# Patient Record
Sex: Female | Born: 1965 | Race: White | Hispanic: No | Marital: Single | Smoking: Never smoker
Health system: Southern US, Community
[De-identification: ages and names within clinical notes are randomized; demographics above are authoritative.]

## PROBLEM LIST (undated history)

## (undated) DIAGNOSIS — T7840XA Allergy, unspecified, initial encounter: Secondary | ICD-10-CM

## (undated) DIAGNOSIS — F32A Depression, unspecified: Secondary | ICD-10-CM

## (undated) DIAGNOSIS — E079 Disorder of thyroid, unspecified: Secondary | ICD-10-CM

## (undated) DIAGNOSIS — I10 Essential (primary) hypertension: Secondary | ICD-10-CM

## (undated) DIAGNOSIS — F329 Major depressive disorder, single episode, unspecified: Secondary | ICD-10-CM

## (undated) HISTORY — DX: Depression, unspecified: F32.A

## (undated) HISTORY — DX: Essential (primary) hypertension: I10

## (undated) HISTORY — DX: Major depressive disorder, single episode, unspecified: F32.9

## (undated) HISTORY — DX: Allergy, unspecified, initial encounter: T78.40XA

## (undated) HISTORY — DX: Disorder of thyroid, unspecified: E07.9

## (undated) HISTORY — PX: CHOLECYSTECTOMY: SHX55

## (undated) HISTORY — PX: GALLBLADDER SURGERY: SHX652

---

## 2007-07-20 DIAGNOSIS — D509 Iron deficiency anemia, unspecified: Secondary | ICD-10-CM | POA: Insufficient documentation

## 2007-11-16 DIAGNOSIS — I10 Essential (primary) hypertension: Secondary | ICD-10-CM | POA: Insufficient documentation

## 2010-07-02 DIAGNOSIS — G4700 Insomnia, unspecified: Secondary | ICD-10-CM | POA: Insufficient documentation

## 2016-09-13 ENCOUNTER — Encounter (INDEPENDENT_AMBULATORY_CARE_PROVIDER_SITE_OTHER): Payer: Self-pay

## 2016-09-13 ENCOUNTER — Ambulatory Visit (INDEPENDENT_AMBULATORY_CARE_PROVIDER_SITE_OTHER): Payer: BLUE CROSS/BLUE SHIELD | Admitting: Pediatrics

## 2016-09-13 ENCOUNTER — Encounter: Payer: Self-pay | Admitting: Pediatrics

## 2016-09-13 VITALS — BP 126/86 | HR 80 | Temp 98.0°F | Ht 67.0 in | Wt 252.4 lb

## 2016-09-13 DIAGNOSIS — Z1231 Encounter for screening mammogram for malignant neoplasm of breast: Secondary | ICD-10-CM | POA: Diagnosis not present

## 2016-09-13 DIAGNOSIS — Z6839 Body mass index (BMI) 39.0-39.9, adult: Secondary | ICD-10-CM | POA: Diagnosis not present

## 2016-09-13 DIAGNOSIS — R42 Dizziness and giddiness: Secondary | ICD-10-CM

## 2016-09-13 DIAGNOSIS — H8113 Benign paroxysmal vertigo, bilateral: Secondary | ICD-10-CM

## 2016-09-13 DIAGNOSIS — I10 Essential (primary) hypertension: Secondary | ICD-10-CM

## 2016-09-13 DIAGNOSIS — Z1239 Encounter for other screening for malignant neoplasm of breast: Secondary | ICD-10-CM

## 2016-09-13 DIAGNOSIS — R350 Frequency of micturition: Secondary | ICD-10-CM

## 2016-09-13 LAB — URINALYSIS, COMPLETE
BILIRUBIN UA: NEGATIVE
GLUCOSE, UA: NEGATIVE
KETONES UA: NEGATIVE
Leukocytes, UA: NEGATIVE
NITRITE UA: NEGATIVE
Protein, UA: NEGATIVE
RBC UA: NEGATIVE
SPEC GRAV UA: 1.02 (ref 1.005–1.030)
UUROB: 0.2 mg/dL (ref 0.2–1.0)
pH, UA: 8.5 — ABNORMAL HIGH (ref 5.0–7.5)

## 2016-09-13 LAB — MICROSCOPIC EXAMINATION
RBC, UA: NONE SEEN /hpf (ref 0–?)
RENAL EPITHEL UA: NONE SEEN /HPF

## 2016-09-13 MED ORDER — LISINOPRIL 20 MG PO TABS: 20.0000 mg | ORAL_TABLET | Freq: Every day | ORAL | 1 refills | Status: DC

## 2016-09-13 MED ORDER — MECLIZINE HCL 12.5 MG PO TABS: 12.5000 mg | ORAL_TABLET | Freq: Three times a day (TID) | ORAL | 1 refills | Status: DC | PRN

## 2016-09-13 NOTE — Progress Notes (Signed)
Subjective:   Patient ID: Kathy Norman, female    DOB: [birth year], 50 y.o.   MRN: [id number] CC: New Patient (Initial Visit); Dizziness; Urinary Urgency; and Trouble sleeping  HPI: Kathy Norman is a 50 y.o. female presenting for New Patient (Initial Visit); Dizziness; Urinary Urgency; and Trouble sleeping  Recently moved to area to be with BF Now cares for 50yo Before was in [place]  Has had some urinary urgency, past few weeks Has had to get up at night mltiple times doesnt think she has been drinking any differently  When she looks to L or R for the past few days, she will get a few seconds of room spinning Goes away after that Has not had problems with dizziness in the past Still able to do all of ADLs  No chest pain, no SOB No fevers Appetite normal  Had colonoscopy in 2008 for rectal bleeding, found to have internal hemorrhoids Recent pap smear prior   Depression screen PHQ 2/9 09/13/2016  Decreased Interest 0  Down, Depressed, Hopeless 0  PHQ - 2 Score 0     Past Medical History:  Diagnosis Date  . Depression   . Hypertension    Family History  Problem Relation Age of Onset  . Heart disease Mother   . Cancer Brother   . Cancer Maternal Aunt   . Cancer Maternal Uncle   . Cancer Maternal Grandmother   Breast cancer in two aunts and GM on mom's side Brother died at 41 with MI  Social History   Social History  . Marital status: Single    Spouse name: N/A  . Number of children: N/A  . Years of education: N/A   Social History Main Topics  . Smoking status: Not on file  . Smokeless tobacco: Not on file  . Alcohol use Not on file  . Drug use: Unknown  . Sexual activity: Not on file   Other Topics Concern  . Not on file   Social History Narrative  . No narrative on file   ROS: All systems negative other than what is in HPI  Objective:    BP 126/86   Pulse 80   Temp 98 F (36.7 C) (Oral)   Ht '5\' 7"'$  (1.702 m)   Wt 252 lb 6.4 oz (114.5 kg)   BMI  39.53 kg/m   Wt Readings from Last 3 Encounters:  09/13/16 252 lb 6.4 oz (114.5 kg)    Gen: NAD, alert, cooperative with exam, NCAT EYES: EOMI, no nystagmus, no conjunctival injection, or no icterus ENT:  TMs pearly gray b/l, OP without erythema LYMPH: no cervical LAD CV: NRRR, normal S1/S2, no murmur, distal pulses 2+ b/l Resp: CTABL, no wheezes, normal WOB Ext: No edema, warm Neuro: Alert and oriented, strength equal b/l UE and LE, coordination grossly normal, CN III-XII intact MSK: normal muscle bulk  Assessment & Plan:  Christon was seen today for new patient (initial visit), multiple complaints. Diagnoses and all orders for this visit:  Benign paroxysmal positional vertigo due to bilateral vestibular disorder Discussed epley maneuvers Try meclizine, RTC if not improving -     meclizine (ANTIVERT) 12.5 MG tablet; Take 1 tablet (12.5 mg total) by mouth 3 (three) times daily as needed for dizziness.  Urinary frequency No dysuria, no fevers -     Urinalysis, Complete -     Microscopic Examination  BMI 39.0-39.9,adult Discussed lifestyle changes, decreased sugar intake, increase physical activity -     TSH  Screening  for breast cancer -     MM Digital Screening; Future  Essential hypertension Well controlle,d cont med Check BPs at home, write down numbers -     lisinopril (PRINIVIL,ZESTRIL) 20 MG tablet; Take 1 tablet (20 mg total) by mouth daily.  Dizziness and giddiness Likely related to BPPV, will check labs -     CMP14+EGFR -     Lipid panel -     CBC with Differential/Platelet   Follow up plan: 2 weeks for CPE Assunta Found, MD [place]

## 2016-09-13 NOTE — Patient Instructions (Addendum)
Kathy Norman Mammogram Appointment: [phone] Epley Maneuver Self-Care WHAT IS THE EPLEY MANEUVER? The Epley maneuver is an exercise you can do to relieve symptoms of benign paroxysmal positional vertigo (BPPV). This condition is often just referred to as vertigo. BPPV is caused by the movement of tiny crystals (canaliths) inside your inner ear. The accumulation and movement of canaliths in your inner ear causes a sudden spinning sensation (vertigo) when you move your head to certain positions. Vertigo usually lasts about 30 seconds. BPPV usually occurs in just one ear. If you get vertigo when you lie on your left side, you probably have BPPV in your left ear. Your health care provider can tell you which ear is involved.  BPPV may be caused by a head injury. Many people older than 50 get BPPV for unknown reasons. If you have been diagnosed with BPPV, your health care provider may teach you how to do this maneuver. BPPV is not life threatening (benign) and usually goes away in time.  WHEN SHOULD I PERFORM THE EPLEY MANEUVER? You can do this maneuver at home whenever you have symptoms of vertigo. You may do the Epley maneuver up to 3 times a day until your symptoms of vertigo go away. HOW SHOULD I DO THE EPLEY MANEUVER? 1. Sit on the edge of a bed or table with your back straight. Your legs should be extended or hanging over the edge of the bed or table.  2. Turn your head halfway toward the affected ear.  3. Lie backward quickly with your head turned until you are lying flat on your back. You may want to position a pillow under your shoulders.  4. Hold this position for 30 seconds. You may experience an attack of vertigo. This is normal. Hold this position until the vertigo stops. 5. Then turn your head to the opposite direction until your unaffected ear is facing the floor.  6. Hold this position for 30 seconds. You may experience an attack of vertigo. This is normal. Hold this position until the  vertigo stops. 7. Now turn your whole body to the same side as your head. Hold for another 30 seconds.  8. You can then sit back up. ARE THERE RISKS TO THIS MANEUVER? In some cases, you may have other symptoms (such as changes in your vision, weakness, or numbness). If you have these symptoms, stop doing the maneuver and call your health care provider. Even if doing these maneuvers relieves your vertigo, you may still have dizziness. Dizziness is the sensation of light-headedness but without the sensation of movement. Even though the Epley maneuver may relieve your vertigo, it is possible that your symptoms will return within 5 years. WHAT SHOULD I DO AFTER THIS MANEUVER? After doing the Epley maneuver, you can return to your normal activities. Ask your doctor if there is anything you should do at home to prevent vertigo. This may include:  Sleeping with two or more pillows to keep your head elevated.  Not sleeping on the side of your affected ear.  Getting up slowly from bed.  Avoiding sudden movements during the day.  Avoiding extreme head movement, like looking up or bending over.  Wearing a cervical collar to prevent sudden head movements. WHAT SHOULD I DO IF MY SYMPTOMS GET WORSE? Call your health care provider if your vertigo gets worse. Call your provider right way if you have other symptoms, including:   Nausea.  Vomiting.  Headache.  Weakness.  Numbness.  Vision changes. This information  is not intended to replace advice given to you by your health care provider. Make sure you discuss any questions you have with your health care provider. Document Released: 10/05/2013 Document Reviewed: 10/05/2013 Elsevier Interactive Patient Education  2017 [employer]

## 2016-09-14 LAB — CMP14+EGFR
ALK PHOS: 76 IU/L (ref 39–117)
ALT: 28 IU/L (ref 0–32)
AST: 26 IU/L (ref 0–40)
Albumin/Globulin Ratio: 1.4 (ref 1.2–2.2)
Albumin: 4.3 g/dL (ref 3.5–5.5)
BUN/Creatinine Ratio: 17 (ref 9–23)
BUN: 14 mg/dL (ref 6–24)
Bilirubin Total: 0.2 mg/dL (ref 0.0–1.2)
CO2: 26 mmol/L (ref 18–29)
CREATININE: 0.81 mg/dL (ref 0.57–1.00)
Calcium: 9.7 mg/dL (ref 8.7–10.2)
Chloride: 102 mmol/L (ref 96–106)
GFR calc Af Amer: 98 mL/min/{1.73_m2} (ref >59)
GFR, EST NON AFRICAN AMERICAN: 85 mL/min/{1.73_m2} (ref >59)
GLOBULIN, TOTAL: 3 g/dL (ref 1.5–4.5)
GLUCOSE: 94 mg/dL (ref 65–99)
Potassium: 4.9 mmol/L (ref 3.5–5.2)
SODIUM: 141 mmol/L (ref 134–144)
Total Protein: 7.3 g/dL (ref 6.0–8.5)

## 2016-09-14 LAB — CBC WITH DIFFERENTIAL/PLATELET
BASOS: 0 %
Basophils Absolute: 0 10*3/uL (ref 0.0–0.2)
EOS (ABSOLUTE): 0 10*3/uL (ref 0.0–0.4)
EOS: 0 %
HEMATOCRIT: 37.8 % (ref 34.0–46.6)
Hemoglobin: 13.4 g/dL (ref 11.1–15.9)
Immature Grans (Abs): 0 10*3/uL (ref 0.0–0.1)
Immature Granulocytes: 0 %
Lymphocytes Absolute: 2.5 10*3/uL (ref 0.7–3.1)
Lymphs: 33 %
MCH: 32.2 pg (ref 26.6–33.0)
MCHC: 35.4 g/dL (ref 31.5–35.7)
MCV: 91 fL (ref 79–97)
MONOS ABS: 0.7 10*3/uL (ref 0.1–0.9)
Monocytes: 10 %
NEUTROS ABS: 4.2 10*3/uL (ref 1.4–7.0)
NEUTROS PCT: 57 %
Platelets: 245 10*3/uL (ref 150–379)
RBC: 4.16 x10E6/uL (ref 3.77–5.28)
RDW: 13.4 % (ref 12.3–15.4)
WBC: 7.4 10*3/uL (ref 3.4–10.8)

## 2016-09-14 LAB — LIPID PANEL
CHOLESTEROL TOTAL: 240 mg/dL — AB (ref 100–199)
Chol/HDL Ratio: 5.5 ratio units — ABNORMAL HIGH (ref 0.0–4.4)
HDL: 44 mg/dL (ref >39)
LDL CALC: 157 mg/dL — AB (ref 0–99)
TRIGLYCERIDES: 194 mg/dL — AB (ref 0–149)
VLDL Cholesterol Cal: 39 mg/dL (ref 5–40)

## 2016-09-14 LAB — TSH: TSH: 3.81 u[IU]/mL (ref 0.450–4.500)

## 2016-09-27 ENCOUNTER — Encounter: Payer: Self-pay | Admitting: Pediatrics

## 2016-09-27 ENCOUNTER — Ambulatory Visit (INDEPENDENT_AMBULATORY_CARE_PROVIDER_SITE_OTHER): Payer: BLUE CROSS/BLUE SHIELD | Admitting: Pediatrics

## 2016-09-27 ENCOUNTER — Ambulatory Visit: Payer: BLUE CROSS/BLUE SHIELD | Admitting: Pediatrics

## 2016-09-27 VITALS — BP 130/89 | HR 78 | Temp 97.6°F | Ht 67.0 in | Wt 254.2 lb

## 2016-09-27 DIAGNOSIS — R350 Frequency of micturition: Secondary | ICD-10-CM

## 2016-09-27 DIAGNOSIS — F32A Depression, unspecified: Secondary | ICD-10-CM | POA: Insufficient documentation

## 2016-09-27 DIAGNOSIS — R42 Dizziness and giddiness: Secondary | ICD-10-CM | POA: Insufficient documentation

## 2016-09-27 DIAGNOSIS — I10 Essential (primary) hypertension: Secondary | ICD-10-CM

## 2016-09-27 DIAGNOSIS — F329 Major depressive disorder, single episode, unspecified: Secondary | ICD-10-CM | POA: Diagnosis not present

## 2016-09-27 LAB — MICROSCOPIC EXAMINATION

## 2016-09-27 LAB — URINALYSIS, COMPLETE
Bilirubin, UA: NEGATIVE
GLUCOSE, UA: NEGATIVE
Ketones, UA: NEGATIVE
Leukocytes, UA: NEGATIVE
NITRITE UA: NEGATIVE
Protein, UA: NEGATIVE
Specific Gravity, UA: >1.005 (ref 1.005–1.030)
UUROB: 0.2 mg/dL (ref 0.2–1.0)
pH, UA: 6 (ref 5.0–7.5)

## 2016-09-27 MED ORDER — ESCITALOPRAM OXALATE 10 MG PO TABS: ORAL_TABLET | ORAL | 5 refills | Status: DC

## 2016-09-27 NOTE — Progress Notes (Signed)
Subjective:   Patient ID: Kathy SouthwardMelinda Norman, female    DOB: 06/01/1966, 50 y.o.   MRN: [id number] CC: Follow-up (Inner Ear) and Depression  HPI: Kathy SouthwardMelinda Norman is a 50 y.o. female presenting for Follow-up (Inner Ear) and Depression  Looking L, R or up feels light headed and like the room is spinning Feeling lasts for a second or two before coming back Started all of a sudden over two weeks ago Has been on meclizine, makes her thirsty and sleepy Has been doing epley maneuvers at home Not sure if it is helping No trouble with hearing  Depression: Has a hard time controlling her mood Has been on citalopram in the past, max dose 20mg  lexapro max dose 10mg  in the past Feels safe at home, no thoughts of self harm  HTN: BPs 120s/80s, 119/87 at home No headaches, no chest pain, no SOB  Still with urinary frequency, gets up 5-6 times a night to use bathroom  Dep[facility] screen [facility] 2/9 09/27/2016 09/13/2016  Decreased Interest 3 0  Down, Depressed, Hopeless 2 0  PHQ - 2 Score 5 0  Altered sleeping 3 -  Tired, decreased energy 3 -  Change in appetite 1 -  Feeling bad or failure about yourself  2 -  Trouble concentrating 0 -  Moving slowly or fidgety/restless 2 -  Suicidal thoughts 0 -  PHQ-9 Score 16 -  Difficult doing work/chores Somewhat difficult -   GAD 7 : Generalized Anxiety Score 09/27/2016  Nervous, Anxious, on Edge 2  Control/stop worrying 2  Worry too much - different things 2  Trouble relaxing 2  Restless 2  Easily annoyed or irritable 2  Afraid - awful might happen 2  Total GAD 7 Score 14  Anxiety Difficulty Somewhat difficult   Relevant past medical, surgical, family and social history reviewed. Allergies and medications reviewed and updated. History  Smoking Status  . Never Smoker  Smokeless Tobacco  . Never Used   ROS: Per HPI   Objective:    BP 130/89   Pulse 78   Temp 97.6 F (36.4 C) (Oral)   Ht 5\' 7"  (1.702 m)   Wt 254 lb 3.2 oz (115.3 kg)   BMI  39.81 kg/m   Wt Readings from Last 3 Encounters:  09/27/16 254 lb 3.2 oz (115.3 kg)  09/13/16 252 lb 6.4 oz (114.5 kg)    Gen: NAD, alert, cooperative with exam, NCAT EYES: EOMI, has vertigo looking to L and R and up, no conjunctival injection, or no icterus ENT:  TMs pearly gray b/l, OP without erythema LYMPH: no cervical LAD CV: NRRR, normal S1/S2, no murmur, distal pulses 2+ b/l Resp: CTABL, no wheezes, normal WOB Ext: No edema, warm Neuro: Alert and oriented, strength equal b/l UE and LE, coordination grossly normal, cerebellar function intact, no nystagmus with L or R-ward gaze, sensation intact face, b/l extremities  Assessment & Plan:  Kathy Norman was seen today for follow-up and depression.  Diagnoses and all orders for this visit:  Depression, unspecified depression type Not controlled Not taking anything now Will start below RTC 8 weeks, sooner if needed Feels safe at home -     escitalopram (LEXAPRO) 10 MG tablet; Take 1 tab for 1 week, then take two tabs daily  Vertigo B/l  No nystagmus With moving eyes R, L or upward has vertigo epley manuvers, meclizine not helping Will refer to ENT for further eval No other focal neuro symptoms -     Ambulatory referral to  ENT  Essential hypertension Well controlled, 110s-120s at home. cont lisinopril No recent changes to BP meds  Urinary frequency: UA without LE last visit Will recheck, send for culture No fevers  Follow up plan: Return in about 2 months (around 11/28/2016). Rex Krasarol Doyle Tegethoff, MD [facility]

## 2016-09-29 LAB — URINE CULTURE

## 2016-10-01 ENCOUNTER — Other Ambulatory Visit: Payer: Self-pay | Admitting: Pediatrics

## 2016-10-01 ENCOUNTER — Telehealth: Payer: Self-pay | Admitting: *Deleted

## 2016-10-01 DIAGNOSIS — N3000 Acute cystitis without hematuria: Secondary | ICD-10-CM

## 2016-10-01 MED ORDER — CITALOPRAM HYDROBROMIDE 20 MG PO TABS: 20.0000 mg | ORAL_TABLET | Freq: Every day | ORAL | 3 refills | Status: AC

## 2016-10-01 MED ORDER — AMOXICILLIN 500 MG PO CAPS: 500.0000 mg | ORAL_CAPSULE | Freq: Two times a day (BID) | ORAL | 0 refills | Status: DC

## 2016-10-01 NOTE — Telephone Encounter (Signed)
[facility] Ins does not [facility], please send something else in

## 2016-10-01 NOTE — Telephone Encounter (Signed)
Please let p tknow--I sent in citalopram. This is the other med she had been on in the past at a low dose, tends to be covered by insurance better. [facility]10 [facility]. Start half a tab for a few days then full tab. After two weeks we can increase to two tabs-- 40mg , she should call me after two weeks if she needs refill to send in higher dose.

## 2016-10-01 NOTE — Telephone Encounter (Signed)
Patient aware.

## 2016-10-15 ENCOUNTER — Encounter: Payer: Self-pay | Admitting: Nurse Practitioner

## 2016-11-29 ENCOUNTER — Ambulatory Visit: Payer: BLUE CROSS/BLUE SHIELD | Admitting: Pediatrics

## 2017-02-10 ENCOUNTER — Encounter: Payer: BLUE CROSS/BLUE SHIELD | Admitting: *Deleted

## 2017-08-04 ENCOUNTER — Other Ambulatory Visit: Payer: Self-pay | Admitting: *Deleted

## 2017-08-04 DIAGNOSIS — I10 Essential (primary) hypertension: Secondary | ICD-10-CM

## 2017-08-04 MED ORDER — LISINOPRIL 20 MG PO TABS: 20.0000 mg | ORAL_TABLET | Freq: Every day | ORAL | 0 refills | Status: DC

## 2017-10-15 DIAGNOSIS — F329 Major depressive disorder, single episode, unspecified: Secondary | ICD-10-CM | POA: Insufficient documentation

## 2018-03-03 ENCOUNTER — Telehealth: Payer: Self-pay | Admitting: Pediatrics

## 2018-04-06 DIAGNOSIS — G4733 Obstructive sleep apnea (adult) (pediatric): Secondary | ICD-10-CM | POA: Insufficient documentation

## 2020-05-04 ENCOUNTER — Encounter: Payer: Self-pay | Admitting: Skilled Nursing Facility1

## 2020-05-04 ENCOUNTER — Encounter: Payer: Medicare Other | Attending: Surgery | Admitting: Skilled Nursing Facility1

## 2020-05-04 ENCOUNTER — Other Ambulatory Visit: Payer: Self-pay

## 2020-05-04 DIAGNOSIS — E669 Obesity, unspecified: Secondary | ICD-10-CM | POA: Diagnosis not present

## 2020-05-04 NOTE — Progress Notes (Signed)
Nutrition Assessment for Bariatric Surgery Medical Nutrition Therapy  Patient was seen on 05/04/2020 for Pre-Operative Nutrition Assessment. Letter of approval faxed to [facility] bariatric surgery program coordinator on 05/04/2020.   Referral stated Supervised Weight Loss (SWL) visits needed: 0  Planned surgery: sleeve  Pt expectation of surgery: to lose weight Pt expectation of dietitian: none reported     NUTRITION ASSESSMENT   Anthropometrics  Start weight at [facility]: 285.5 lbs (date: 05/04/2020)  Height: 65 in BMI: 47.54 kg/m2     Clinical  Medical hx: sleep apnea, hyperlipidemia  Medications:  See list Labs:  Notable signs/symptoms: swollen feet, back pain Any previous deficiencies? No  Micronutrient Nutrition Focused Physical Exam: Hair: No issues observed Eyes: No issues observed Mouth: No issues observed Neck: No issues observed Nails: No issues observed Skin: No issues observed  Lifestyle & Dietary Hx  Pt states she does do talk therapy every 6 months: Dietitian encouraged pt to increase these visits surrounding surgery to help with continued success.  Pt states she has depression daily.   24-Hr Dietary Recall First Meal: cheerios Snack: fruit Second Meal: skipped or granola bar or fruit Snack: fruit Third Meal: chicken or pre made salad Snack:  Beverages: soda, water, v8 juice, ornage juice   Estimated Energy Needs Calories: 1500   NUTRITION DIAGNOSIS  Overweight/obesity ([place]-3.3) related to past poor dietary habits and physical inactivity as evidenced by patient w/ planned sleeve gastrectomy surgery following dietary guidelines for continued weight loss.    NUTRITION INTERVENTION  Nutrition counseling (C-1) and education (E-2) to facilitate bariatric surgery goals.   Pre-Op Goals Reviewed with the Patient . Track food and beverage intake (pen and paper, MyFitness Pal, Baritastic app, etc.) . Make healthy food choices while monitoring  portion sizes . Consume 3 meals per day or try to eat every 3-5 hours . Avoid concentrated sugars and fried foods . Keep sugar & fat in the single digits per serving on food labels . Practice CHEWING your food (aim for applesauce consistency) . Practice not drinking 15 minutes before, during, and 30 minutes after each meal and snack . Avoid all carbonated beverages (ex: soda, sparkling beverages)  . Limit caffeinated beverages (ex: coffee, tea, energy drinks) . Avoid all sugar-sweetened beverages (ex: regular soda, sports drinks)  . Avoid alcohol  . Aim for 64-100 ounces of FLUID daily (with at least half of fluid intake being plain water)  . Aim for at least 60-80 grams of PROTEIN daily . Look for a liquid protein source that contains ?15 g protein and ?5 g carbohydrate (ex: shakes, drinks, shots) . Make a list of non-food related activities . Physical activity is an important part of a healthy lifestyle so keep it moving! The goal is to reach 150 minutes of exercise per week, including cardiovascular and weight baring activity.  *Goals that are bolded indicate the pt would like to start working towards these  Handouts Provided Include  . Bariatric Surgery handouts (Nutrition Visits, Pre-Op Goals, Protein Shakes, Vitamins & Minerals)  Learning Style & Readiness for Change Teaching method utilized: Visual & Auditory  Demonstrated degree of understanding via: Teach Back  Barriers to learning/adherence to lifestyle change: none identified    MONITORING & EVALUATION Dietary intake, weekly physical activity, body weight, and pre-op goals reached at next nutrition visit.    Next Steps  Patient is to follow up at [facility] for Pre-Op Class >2 weeks before surgery for further nutrition education.

## 2020-05-16 ENCOUNTER — Other Ambulatory Visit: Payer: Self-pay | Admitting: Surgery

## 2020-05-16 ENCOUNTER — Other Ambulatory Visit (HOSPITAL_COMMUNITY): Payer: Self-pay | Admitting: Surgery

## 2020-05-17 ENCOUNTER — Encounter (HOSPITAL_BASED_OUTPATIENT_CLINIC_OR_DEPARTMENT_OTHER): Payer: Self-pay

## 2020-05-24 ENCOUNTER — Ambulatory Visit (HOSPITAL_COMMUNITY)
Admission: RE | Admit: 2020-05-24 | Discharge: 2020-05-24 | Disposition: A | Discharge status: Home / Self Care | Payer: Medicare Other | Source: Ambulatory Visit | Attending: Surgery | Admitting: Surgery

## 2020-05-24 ENCOUNTER — Other Ambulatory Visit: Payer: Self-pay

## 2020-06-08 ENCOUNTER — Other Ambulatory Visit: Payer: Self-pay

## 2020-06-08 ENCOUNTER — Ambulatory Visit: Payer: Medicare Other | Attending: Surgery | Admitting: Neurology

## 2020-06-08 DIAGNOSIS — Z6841 Body Mass Index (BMI) 40.0 and over, adult: Secondary | ICD-10-CM | POA: Diagnosis not present

## 2020-06-08 DIAGNOSIS — Z79899 Other long term (current) drug therapy: Secondary | ICD-10-CM | POA: Diagnosis not present

## 2020-06-08 DIAGNOSIS — Z792 Long term (current) use of antibiotics: Secondary | ICD-10-CM | POA: Insufficient documentation

## 2020-06-08 DIAGNOSIS — G4733 Obstructive sleep apnea (adult) (pediatric): Secondary | ICD-10-CM | POA: Diagnosis not present

## 2020-06-12 NOTE — Procedures (Signed)
  [facility] Cora Stetson A. Gerilyn Pilgrim, MD     [email or web address]             NOCTURNAL POLYSOMNOGRAPHY   LOCATION: [facility]  Patient Name: Kathy Norman, Kathy Norman Date: 06/08/2020 Gender: Female D.O.B: 11-11-1965 Age (years): 63 Referring Provider: Phylliss Blakes MD Height (inches): 65 Interpreting Physician: Beryle Beams MD, ABSM Weight (lbs): 287 RPSGT: Peak, Robert BMI: 48 MRN: [id number] Neck Size: 16.00 CLINICAL INFORMATION Sleep Study Type: NPSG     Indication for sleep study: N/A     Epworth Sleepiness Score: 3     SLEEP STUDY TECHNIQUE As per the AASM Manual for the Scoring of Sleep and Associated Events v2.3 (April 2016) with a hypopnea requiring 4% desaturations.  The channels recorded and monitored were frontal, central and occipital EEG, electrooculogram (EOG), submentalis EMG (chin), nasal and oral airflow, thoracic and abdominal wall motion, anterior tibialis EMG, snore microphone, electrocardiogram, and pulse oximetry.  MEDICATIONS Medications self-administered by patient taken the night of the study : N/A  Current Outpatient Medications:  .  amoxicillin (AMOXIL) 500 MG capsule, Take 1 capsule (500 mg total) by mouth 2 (two) times daily., Disp: 10 capsule, Rfl: 0 .  citalopram (CELEXA) 20 MG tablet, Take 1 tablet (20 mg total) by mouth daily., Disp: 30 tablet, Rfl: 3 .  lisinopril (PRINIVIL,ZESTRIL) 20 MG tablet, Take 1 tablet (20 mg total) by mouth daily., Disp: 30 tablet, Rfl: 0 .  meclizine (ANTIVERT) 12.5 MG tablet, Take 1 tablet (12.5 mg total) by mouth 3 (three) times daily as needed for dizziness., Disp: 60 tablet, Rfl: 1     SLEEP ARCHITECTURE The study was initiated at 8:43:59 PM and ended at 5:00:21 AM.  Sleep onset time was 108.9 minutes and the sleep efficiency was 70.9%%. The total sleep time was 351.9 minutes.  Stage REM latency was N/A minutes.  The patient spent 5.7%% of the night in stage N1 sleep, 88.5%% in stage N2  sleep, 5.8%% in stage N3 and 0% in REM.  Alpha intrusion was absent.  Supine sleep was 26.84%.  RESPIRATORY PARAMETERS The overall apnea/hypopnea index (AHI) was 10.7 per hour. There were 4 total apneas, including 0 obstructive, 4 central and 0 mixed apneas. There were 59 hypopneas and 4 RERAs.  The AHI during Stage REM sleep was N/A per hour.  AHI while supine was 14.6 per hour.  The mean oxygen saturation was 91.7%. The minimum SpO2 during sleep was 87.0%.  moderate snoring was noted during this study.  CARDIAC DATA The 2 lead EKG demonstrated sinus rhythm. The mean heart rate was 74.6 beats per minute. Other EKG findings include: None. LEG MOVEMENT DATA The total PLMS were 0 with a resulting PLMS index of 0.0. Associated arousal with leg movement index was 0.0.  IMPRESSIONS Mild obstructive sleep apnea is noted with this study. Auto-PAP 8-15 is suggested.    Argie Ramming, MD Diplomate, American Board of Sleep Medicine.  ELECTRONICALLY SIGNED ON:  06/12/2020, 8:37 AM [facility] PH: [phone]   FX: [phone] ACCREDITED BY THE AMERICAN ACADEMY OF SLEEP MEDICINE

## 2020-08-01 ENCOUNTER — Other Ambulatory Visit: Payer: Self-pay

## 2020-08-01 ENCOUNTER — Encounter: Payer: Medicare Other | Attending: Surgery | Admitting: Skilled Nursing Facility1

## 2020-08-01 DIAGNOSIS — Z713 Dietary counseling and surveillance: Secondary | ICD-10-CM | POA: Diagnosis not present

## 2020-08-01 DIAGNOSIS — E669 Obesity, unspecified: Secondary | ICD-10-CM

## 2020-08-01 DIAGNOSIS — Z6841 Body Mass Index (BMI) 40.0 and over, adult: Secondary | ICD-10-CM | POA: Insufficient documentation

## 2020-08-01 NOTE — Progress Notes (Signed)
Supervised Weight Loss Visit Bariatric Nutrition Education  Planned Surgery: sleeve  1 out of 6 SWL Appointments   Star given previously: Yes  NUTRITION ASSESSMENT  Anthropometrics  Start weight at [facility]: 285.5 lbs (date: 05/04/2020)  Height: 65 in BMI: 47.54 kg/m2     Clinical  Medical hx: sleep apnea, hyperlipidemia  Medications:  See list Labs:  Notable signs/symptoms: swollen feet, back pain Any previous deficiencies? No  Lifestyle & Dietary Hx  Pt states she is still avoiding soda and has been walking more and trying to cut back on junk food cutting back from 2 slices of cake to 1 and trying not to always have dessert.   Estimated daily fluid intake: unknown oz Supplements:  Current average weekly physical activity: walking 7 days a week  24-Hr Dietary Recall First Meal: cereal + fruit Snack: fruit Second Meal: fruit Snack:  Third Meal: baked potato + meat + dessert Snack:  Beverages: water, orange juice, soda  Estimated Energy Needs Calories: 1500  NUTRITION DIAGNOSIS  Overweight/obesity ([place]-3.3) related to past poor dietary habits and physical inactivity as evidenced by patient w/ planned sleeve gastrectomy surgery following dietary guidelines for continued weight loss.   NUTRITION INTERVENTION  Nutrition counseling (C-1) and education (E-2) to facilitate bariatric surgery goals.  Pre-Op Goals Progress & New Goals  Continue walking  Continue cutting back on soda  Continue limiting dessert  Continue limiting chips and sweets  Avoid having fruit with cereal  Have an actual meal for lunch instead of just fruit; such as fruit + green beans + chicken   Handouts Provided Include   Detailed MyPlate  Learning Style & Readiness for Change Teaching method utilized: Visual & Auditory  Demonstrated degree of understanding via: Teach Back  Barriers to learning/adherence to lifestyle change: none stated  RD's Notes for next Visit   Assess pts  adherence to chose goals   Check in with what is added to food for flavor   MONITORING & EVALUATION Dietary intake, weekly physical activity, body weight, and pre-op goals in 1 month.   Next Steps  Patient is to return to [facility]   Patient is to follow-up in 1 month

## 2020-08-28 ENCOUNTER — Encounter: Payer: Medicare Other | Attending: Surgery | Admitting: Skilled Nursing Facility1

## 2020-08-28 ENCOUNTER — Other Ambulatory Visit: Payer: Self-pay

## 2020-08-28 DIAGNOSIS — E669 Obesity, unspecified: Secondary | ICD-10-CM | POA: Insufficient documentation

## 2020-08-28 NOTE — Progress Notes (Signed)
Supervised Weight Loss Visit Bariatric Nutrition Education  Planned Surgery: sleeve  2 out of 6 SWL Appointments   Star given previously: Yes  NUTRITION ASSESSMENT  Anthropometrics  Start weight at [facility]: 285.5 lbs (date: 05/04/2020)  Weight 281 pounds BMI: 44.01 kg/m2     Clinical  Medical hx: sleep apnea, hyperlipidemia  Medications:  See list Labs:  Notable signs/symptoms: swollen feet, back pain Any previous deficiencies? No  Lifestyle & Dietary Hx  Pt states sitting around doing nothing gets her into snacking and drinking soda which has been a barrier to her making changes.  Pt states she is not sure she wants surgery due to her mother not wanting her to have it and the general risk of having a surgery.   Estimated daily fluid intake: unknown oz Supplements:  Current average weekly physical activity: walking 7 days a week  24-Hr Dietary Recall First Meal: cereal + fruit Snack: fruit Second Meal: fruit Snack:  Third Meal: baked potato + meat + dessert Snack:  Beverages: water, orange juice, soda  Estimated Energy Needs Calories: 1500  NUTRITION DIAGNOSIS  Overweight/obesity ([place]-3.3) related to past poor dietary habits and physical inactivity as evidenced by patient w/ planned sleeve gastrectomy surgery following dietary guidelines for continued weight loss.   NUTRITION INTERVENTION  Nutrition counseling (C-1) and education (E-2) to facilitate bariatric surgery goals.  Pre-Op Goals Progress & New Goals . Continue walking . Continue cutting back on soda: limit to half of one per day . Continue limiting dessert . Continue limiting chips and sweets . Avoid having fruit with cereal . Have an actual meal for lunch instead of just fruit; such as fruit + green beans + chicken  . NEW: Measure out your portions using the measuring cup to ensure they are appropriate . NEW: Do not eat fruit WITH cereal . NEW: Eat every 3-5 hours . NEW: One snack in between meals  is okay such as 1 apple  Handouts Previously Provided Include   Detailed MyPlate  Learning Style & Readiness for Change Teaching method utilized: Visual & Auditory  Demonstrated degree of understanding via: Teach Back  Barriers to learning/adherence to lifestyle change: none stated  RD's Notes for next Visit  . Assess pts adherence to chose goals  . Check in with what is added to food for flavor   MONITORING & EVALUATION Dietary intake, weekly physical activity, body weight, and pre-op goals in 1 month.   Next Steps  Patient is to return to [facility]   Patient is to follow-up in 1 month

## 2020-09-26 ENCOUNTER — Ambulatory Visit: Payer: Medicare Other | Admitting: Skilled Nursing Facility1

## 2020-10-02 ENCOUNTER — Ambulatory Visit: Payer: Medicare Other | Admitting: Skilled Nursing Facility1

## 2020-10-09 ENCOUNTER — Telehealth: Payer: Self-pay | Admitting: Skilled Nursing Facility1

## 2020-10-09 NOTE — Telephone Encounter (Signed)
Called pt to reschedule no show.   LVM

## 2022-07-08 IMAGING — DX DG CHEST 2V
2 series · 2 of 2 positions shown · non-contrast
Comparison: None.

CLINICAL DATA: Preop bariatric surgery (sleeve), date not set,
previous cholecystectomy, c-section distantly, no chest complaints

EXAM:
CHEST - 2 VIEW

[chest pa]
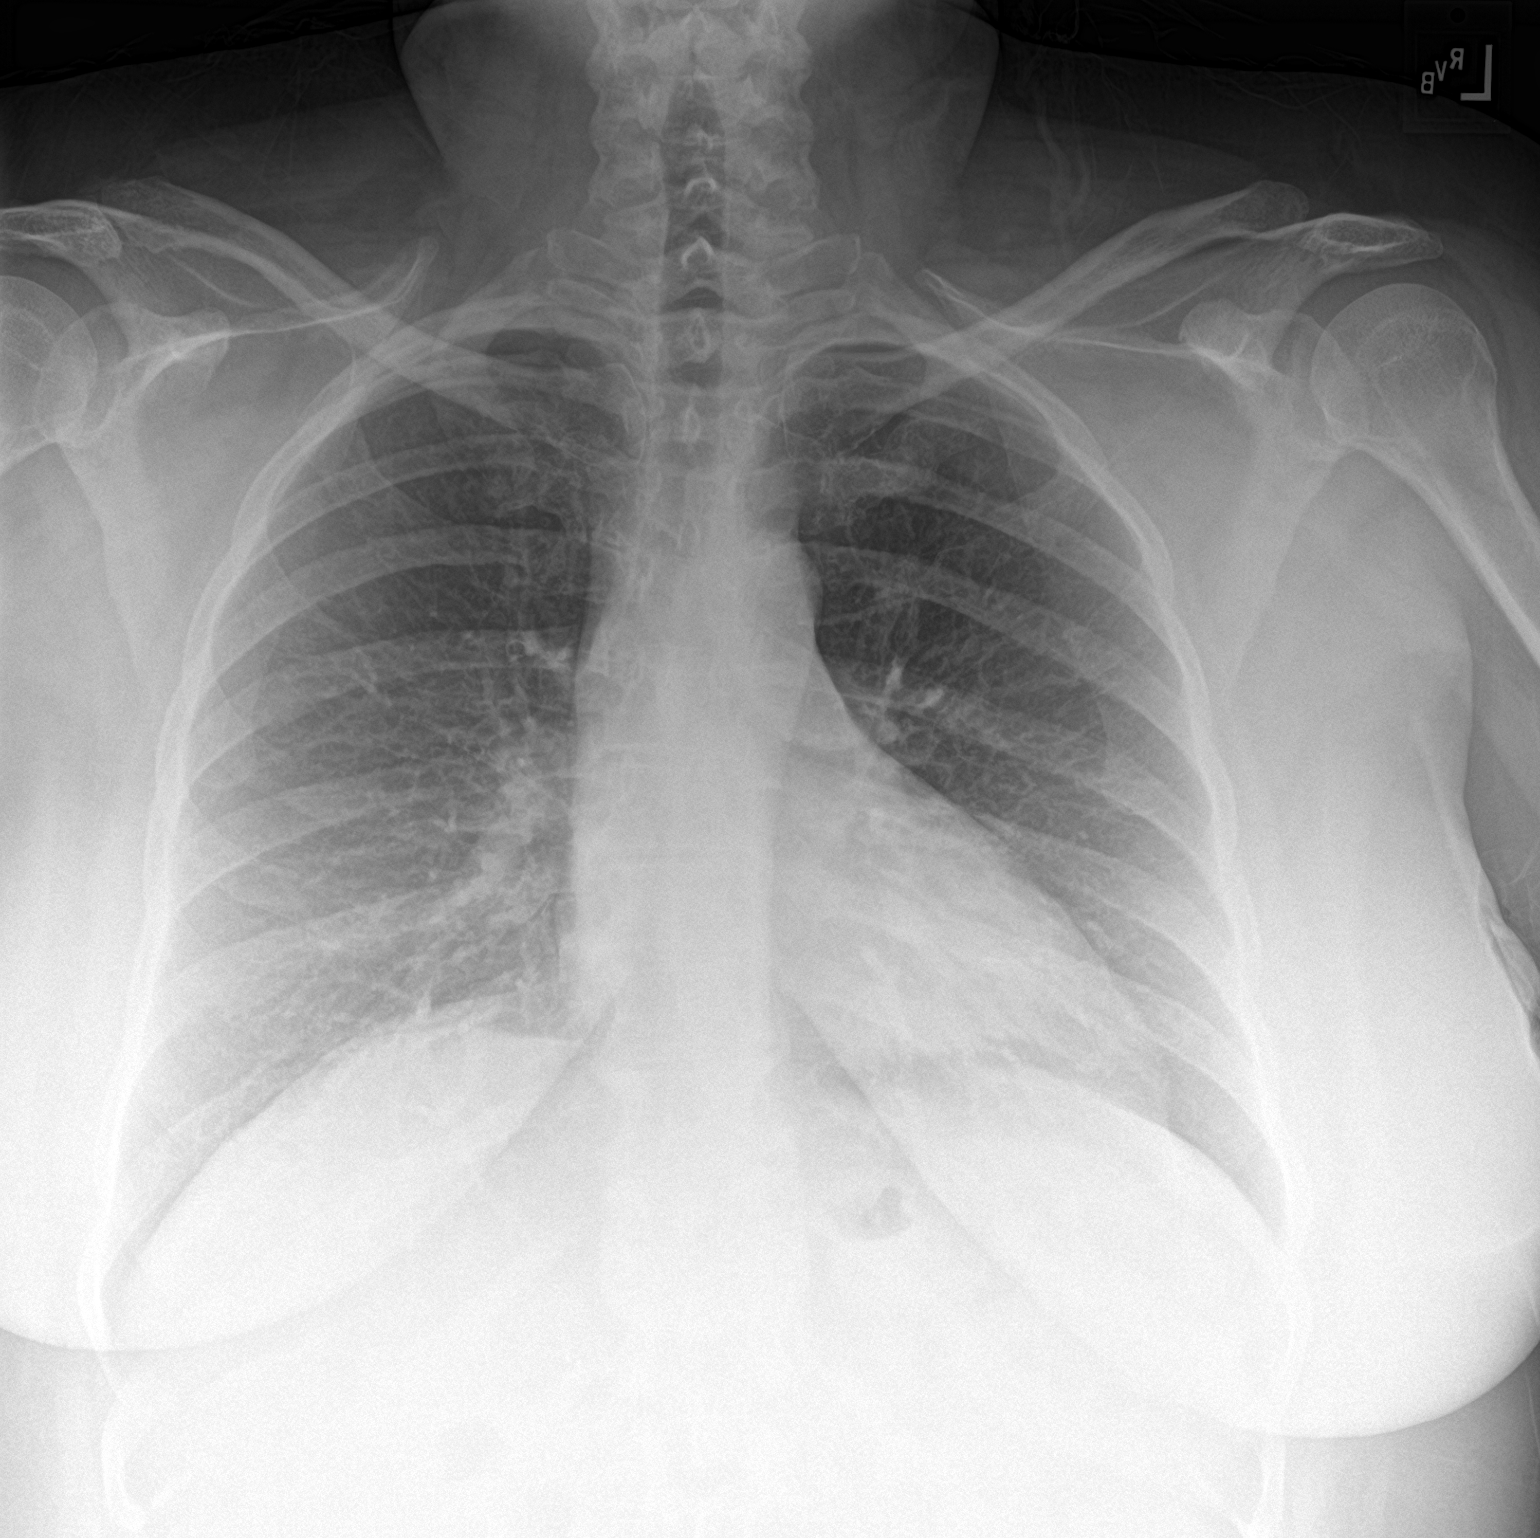

[chest lat]
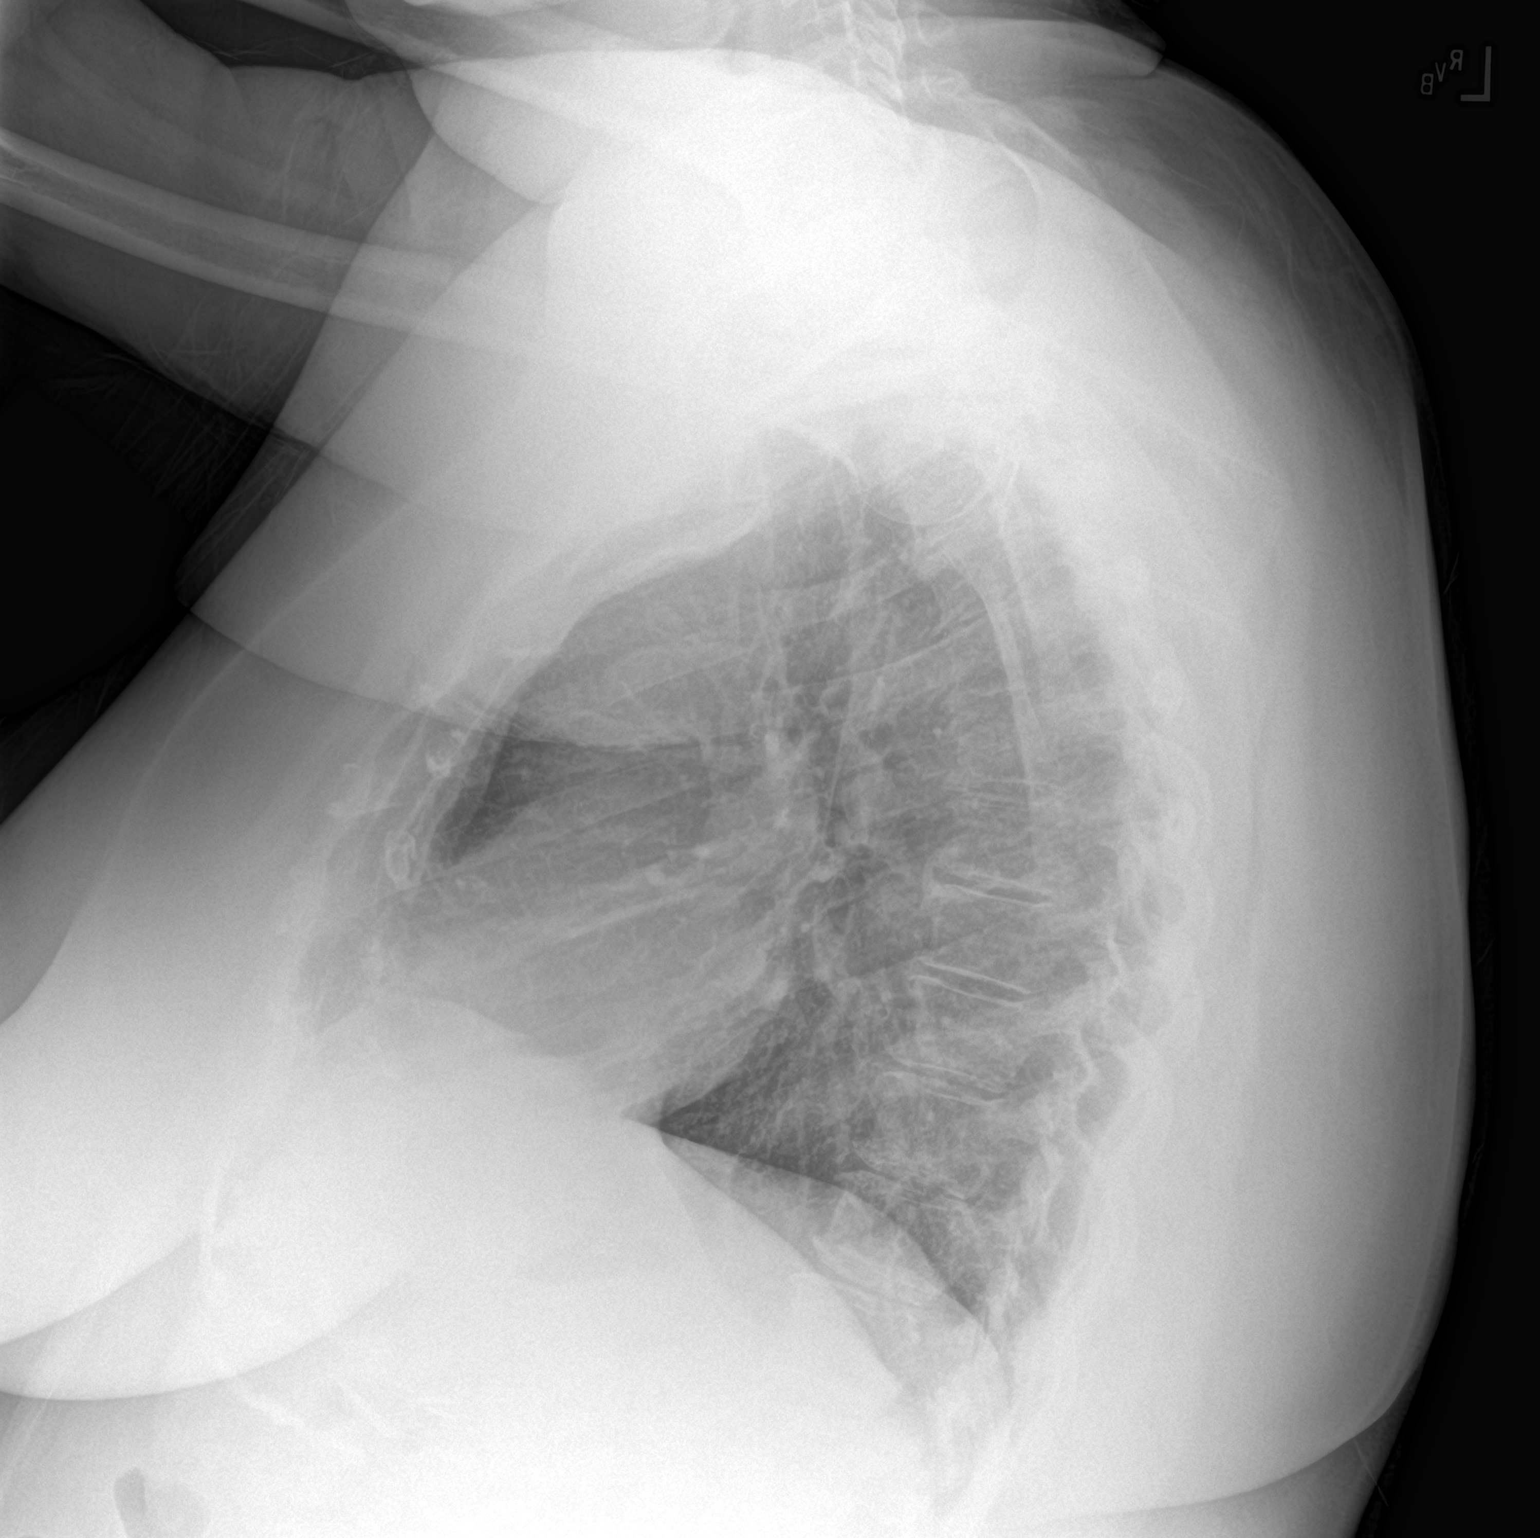

[2 of 2 positions shown; findings below may reference images not displayed]

FINDINGS: The heart size and mediastinal contours are within normal limits.
The lungs are clear. No pneumothorax or pleural effusion.
Degenerative changes are noted in the thoracic spine.
IMPRESSION: No active cardiopulmonary disease.

## 2022-07-08 IMAGING — RF DG UGI W/ HIGH DENSITY W/O KUB
11 of 15 series · 14 of 24 positions shown · non-contrast
Comparison: None.

CLINICAL DATA: Morbid obesity.  Preop bariatric surgery.

EXAM:
UPPER GI SERIES WITH KUB
TECHNIQUE: After obtaining a scout radiograph a routine upper GI series was
performed using thin barium.
FLUOROSCOPY TIME:  Fluoroscopy Time:  2 minutes 18 seconds
Radiation Exposure Index (if provided by the fluoroscopic device):
69 mGy
Number of Acquired Spot Images: 0

[Series 1: t abdomen supine · 0.15mm/px · 1 of 1 slices shown]
[im 1/1]
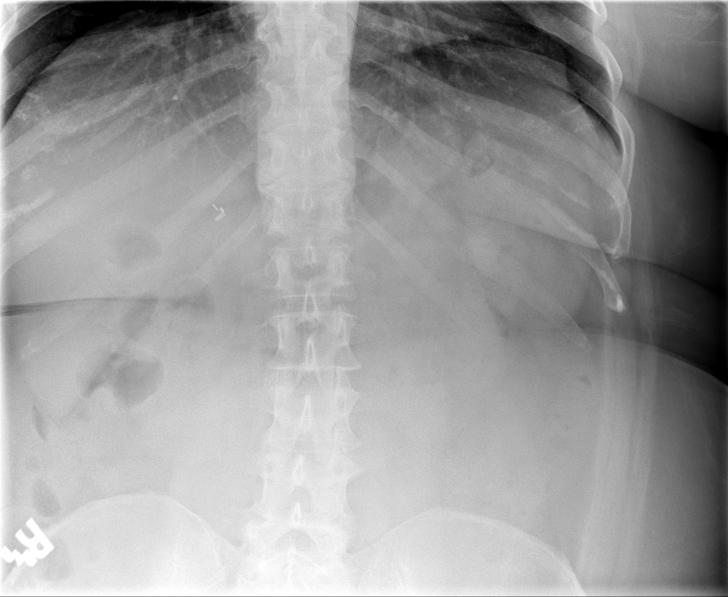

[Series 2: cp_standard · 0.51mm/px · 2 of 64 frames shown (1 of 10)]
[frame 33/64]
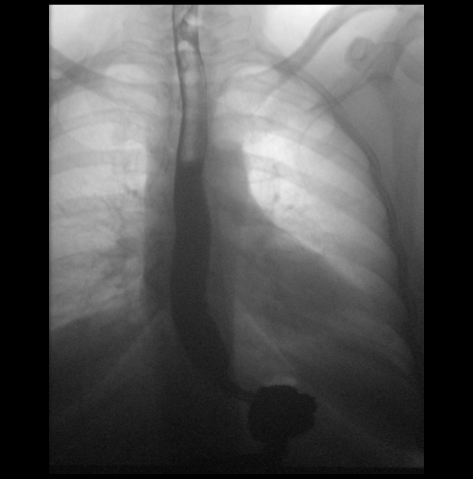
[frame 55/64]
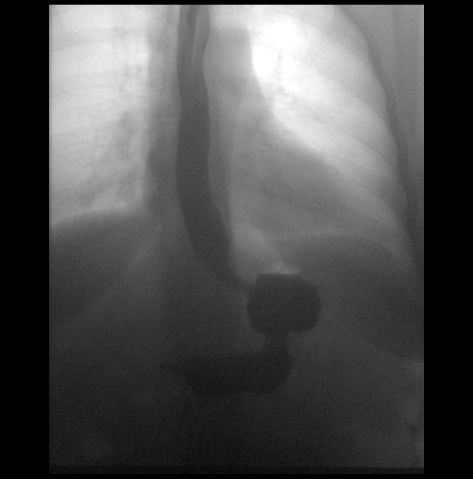

[Series 4: cp_standard · 0.26mm/px · 1 of 1 slices shown (2 of 10)]
[im 1/1]
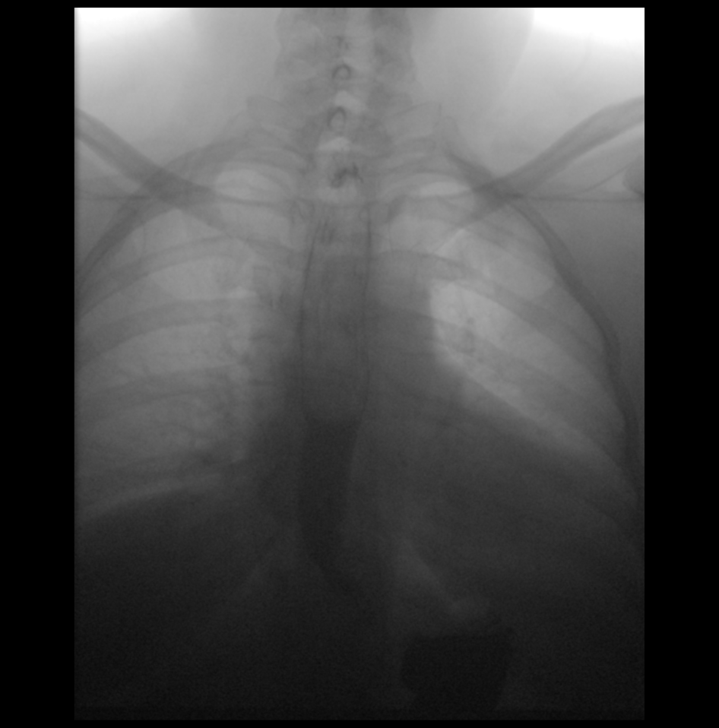

[Series 5: cp_standard · 0.18mm/px · 1 of 1 slices shown (3 of 10)]
[im 1/1]
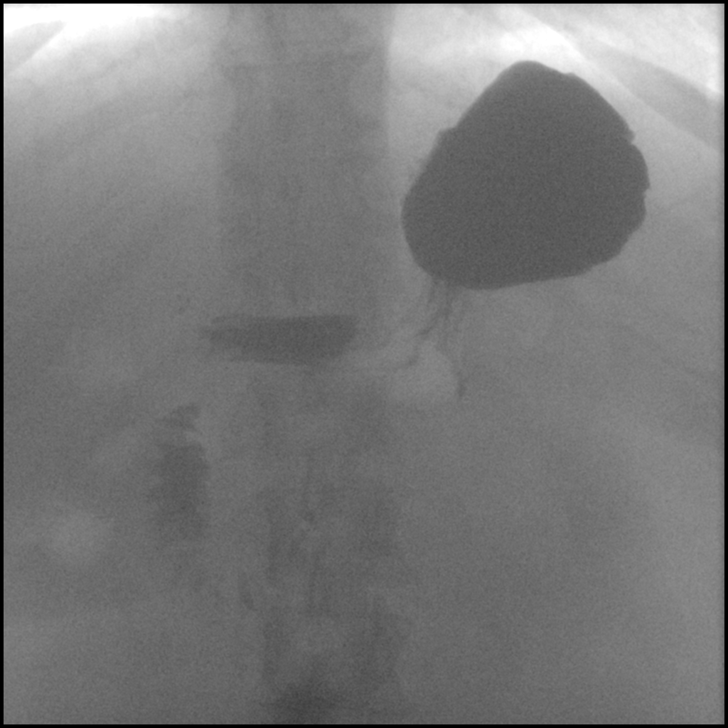

[Series 7: cp_standard · 0.18mm/px · 1 of 1 slices shown (4 of 10)]
[im 1/1]
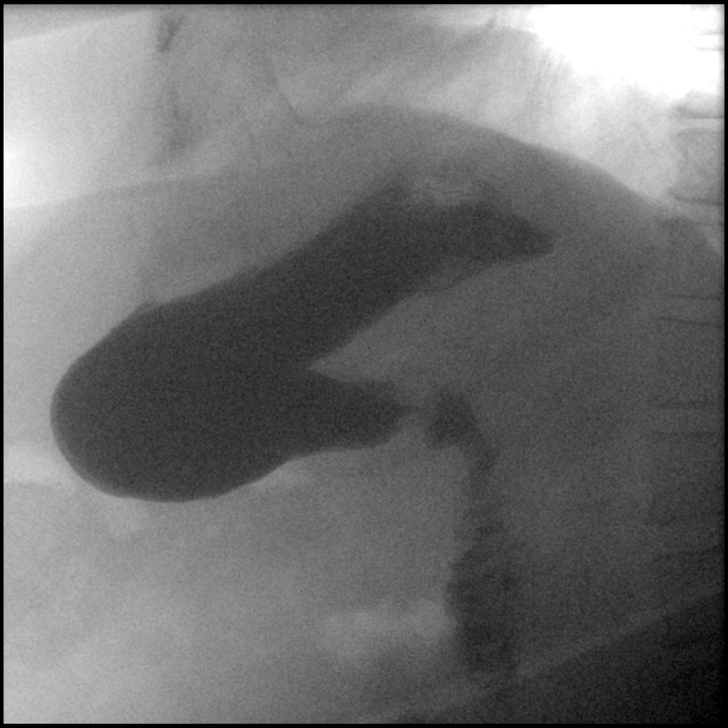

[Series 9: cp_standard · 0.18mm/px · 1 of 1 slices shown (5 of 10)]
[im 1/1]
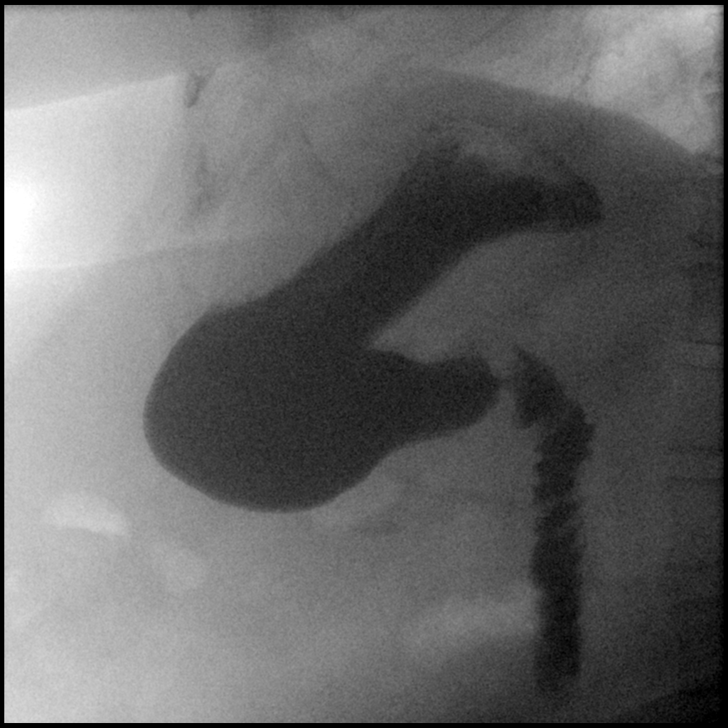

[Series 10: cp_standard · 0.18mm/px · 1 of 1 slices shown (6 of 10)]
[im 1/1]
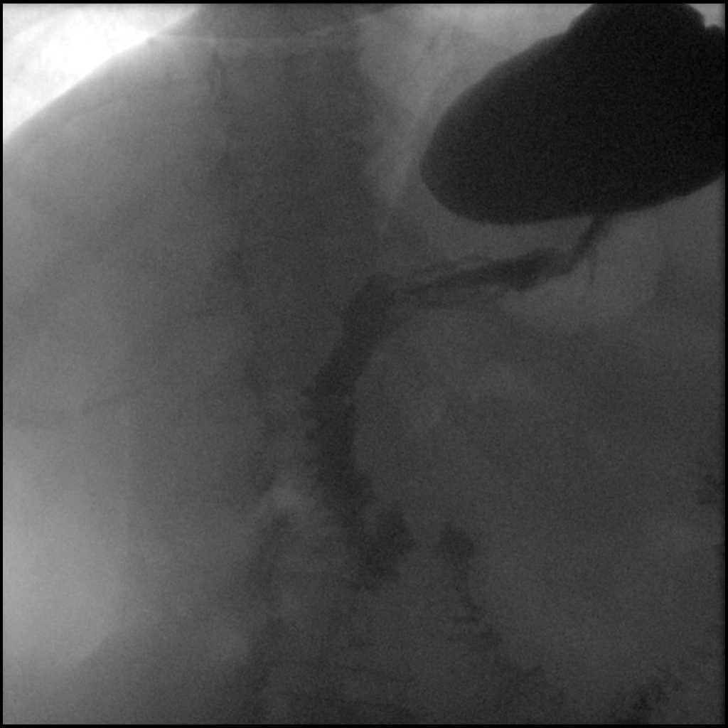

[Series 12: cp_standard · 0.56mm/px · 2 of 140 frames shown (7 of 10)]
[frame 22/140]
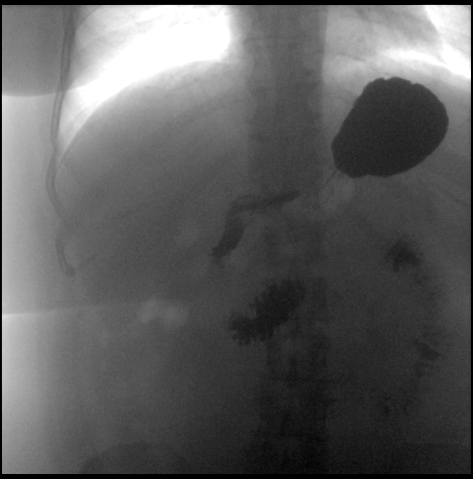
[frame 112/140]
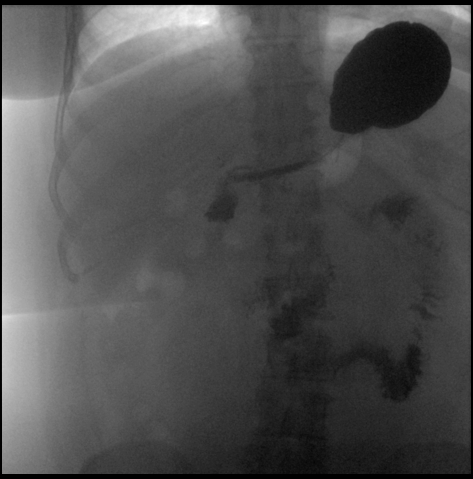

[Series 13: cp_standard · 0.28mm/px · 1 of 1 slices shown (8 of 10)]
[im 1/1]
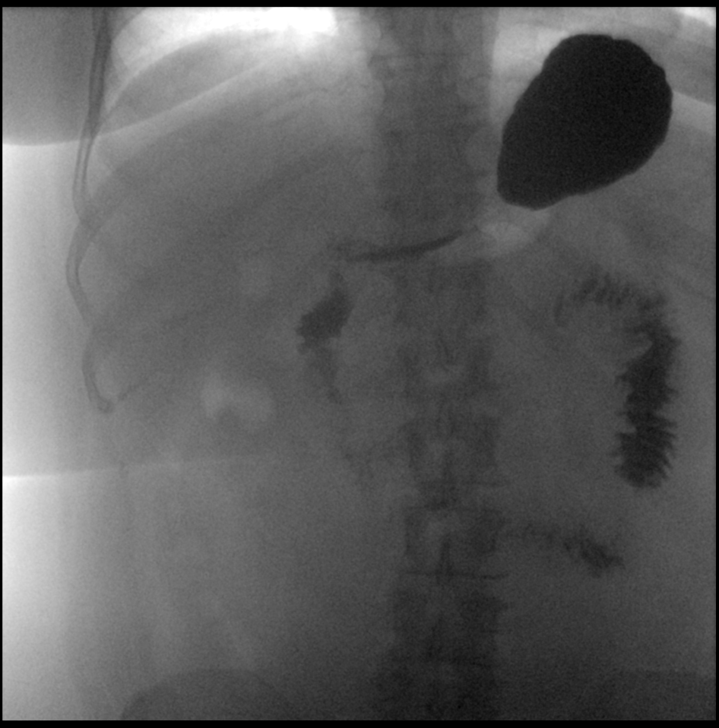

[Series 14: cp_standard · 0.28mm/px · 1 of 1 slices shown (9 of 10)]
[im 1/1]
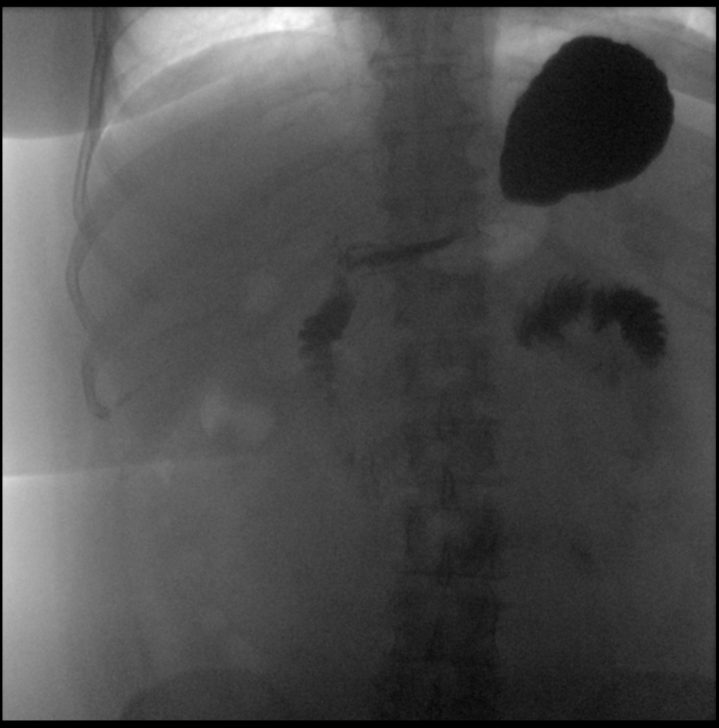

[Series 15: cp_standard · 0.56mm/px · 2 of 89 frames shown (10 of 10)]
[frame 40/89]
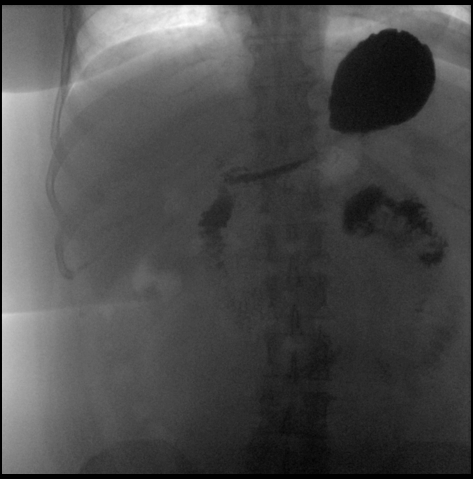
[frame 76/89]
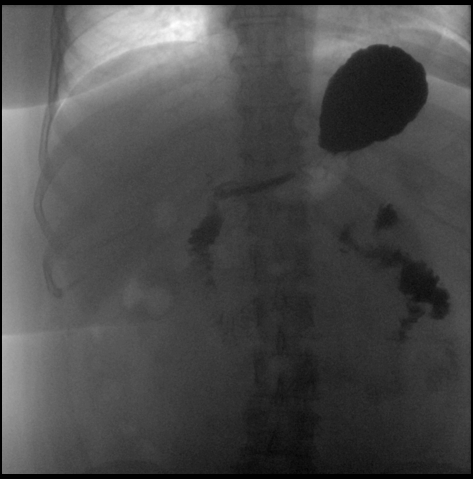

[14 of 24 positions shown; findings below may reference images not displayed]

FINDINGS: The patient ingested water-soluble contrast material which easily
passed through a normal appearing esophagus filling the stomach. No
esophageal mass or stricture identified.

The stomach has a normal configuration and empties normally. No
hiatal hernia or reflux identified. The duodenal bulb appears
normal. Atypically elongated duodenal C loop is visualized which
dips towards the left lower quadrant of the abdomen before returning
to the normal location of the duodenal jejunal junction at the
ligament of Treitz.
IMPRESSION: 1. Normal appearance of the esophagus and stomach.
2. Atypically elongated duodenal C loop with normal location of the
duodenal jejunal junction in the expected location of the ligament
of Treitz

## 2022-09-18 ENCOUNTER — Encounter: Payer: Self-pay | Admitting: Neurology

## 2022-09-18 ENCOUNTER — Ambulatory Visit (INDEPENDENT_AMBULATORY_CARE_PROVIDER_SITE_OTHER): Payer: Medicare Other | Admitting: Neurology

## 2022-09-18 VITALS — BP 145/81 | HR 89 | Ht 64.0 in | Wt 296.4 lb

## 2022-09-18 DIAGNOSIS — Z6841 Body Mass Index (BMI) 40.0 and over, adult: Secondary | ICD-10-CM

## 2022-09-18 DIAGNOSIS — G4733 Obstructive sleep apnea (adult) (pediatric): Secondary | ICD-10-CM | POA: Diagnosis not present

## 2022-09-18 NOTE — Progress Notes (Addendum)
Subjective:    Patient ID: Kathy Norman is a 56 y.o. female.  HPI    Star Age, MD, PhD [facility] [address], [address] [place], [place] [address]  I saw Kathy Norman as a referral from Dr. Freddie Apley office for evaluation of her sleep apnea, request for transfer of care.  The patient is unaccompanied today.  Kathy Norman is a 56 year old female with an underlying medical history of hypertension, depression, hypothyroidism, back pain, reflux disease, and obesity, who reports doing well on her autoPAP. She is not sure about the pressure setting. A DL was not available, we could not get the SD card to read her data, she has a ResMed air sense 11 AutoSet machine.  She reports a prior diagnosis of obstructive sleep apnea.  She has been on PAP therapy for the past 2 years.  She reports full compliance and great benefit, she reports that her daytime sleepiness improved, her sleep quality improved and her nocturia improved.  She is in the process of transitioning to a new primary care as well.  Her bedtime is generally around 9 PM, rise time is around 7:30 AM or 8 AM.  She is single and lives alone, no pets in the household, she has 1 grown son.  She is on disability for her low back pain and her depression.  She is followed by mental health.  She is a non-smoker and does not drink any alcohol, she drinks quite a bit of caffeine in the form of soda, about 4 16.9 ounce bottles per day.  She is working on weight loss.  She is not followed by weight management clinic.  She has no night to night nocturia and no recurrent nocturnal or morning headaches.  She uses a nasal mask, DME company is [employer].  Her Epworth sleepiness score is 6 out of 24, fatigue severity score is 9 out of 63. She had a baseline sleep study from [facility] on 06/08/2020 and I was able to review the results: She had mild sleep apnea, AHI was 10.7/h.  Her sleep latency was delayed at 108.9  minutes.  Sleep efficiency was reduced at 70.9%.  REM sleep was absent.  Average oxygen saturation was 93%, nadir was 87%.   Addendum, 09/25/2022: I reviewed patients compliance data for the recent 90 days from 06/20/2022 through 09/17/2022, she was fully compliant with treatment with an average usage of 7-1/2 hours, leak acceptable, 95th percentile of pressure at 11.4 cm, AHI at goal with an average of 0.6/h.  Her Past Medical History Is Significant For: Past Medical History:  Diagnosis Date   Depression    Hypertension     Her Past Surgical History Is Significant For: Past Surgical History:  Procedure Laterality Date   CESAREAN SECTION     GALLBLADDER SURGERY      Her Family History Is Significant For: Family History  Problem Relation Age of Onset   Heart disease Mother    Cancer Brother    Cancer Maternal Aunt    Cancer Maternal Uncle    Cancer Maternal Grandmother    Sleep apnea Niece    Sleep apnea Nephew     Her Social History Is Significant For: Social History   Socioeconomic History   Marital status: Single    Spouse name: Not on file   Number of children: Not on file   Years of education: Not on file   Highest education level: Not on file  Occupational History   Not on file  Tobacco Use   Smoking status: Never   Smokeless tobacco: Never  Substance and Sexual Activity   Alcohol use: No   Drug use: No   Sexual activity: Not on file  Other Topics Concern   Not on file  Social History Narrative   Not on file   Social Determinants of Health   Financial Resource Strain: Not on file  Food Insecurity: Not on file  Transportation Needs: Not on file  Physical Activity: Not on file  Stress: Not on file  Social Connections: Not on file    Her Allergies Are:  Allergies  Allergen Reactions   Sertraline Rash  :   Her Current Medications Are:  Outpatient Encounter Medications as of 09/18/2022  Medication Sig   amitriptyline (ELAVIL) 50 MG tablet Take 50 mg  by mouth daily.   aspirin EC 81 MG tablet Take 1 tablet by mouth daily.   Cholecalciferol 125 MCG (5000 UT) TABS Take 1 tablet by mouth daily.   citalopram (CELEXA) 20 MG tablet Take 1 tablet (20 mg total) by mouth daily. (Patient taking differently: Take 20 mg by mouth 2 (two) times daily.)   levothyroxine (SYNTHROID) 75 MCG tablet Take 1 tablet by mouth daily.   lisinopril (PRINIVIL,ZESTRIL) 20 MG tablet Take 1 tablet (20 mg total) by mouth daily.   simvastatin (ZOCOR) 20 MG tablet Take 20 mg by mouth at bedtime.   amoxicillin (AMOXIL) 500 MG capsule Take 1 capsule (500 mg total) by mouth 2 (two) times daily.   meclizine (ANTIVERT) 12.5 MG tablet Take 1 tablet (12.5 mg total) by mouth 3 (three) times daily as needed for dizziness.   No facility-administered encounter medications on file as of 09/18/2022.  :   Review of Systems:  Out of a complete 14 point review of systems, all are reviewed and negative with the exception of these symptoms as listed below:          Review of Systems  Neurological:        Pt  here for sleep consult  Pt states if she doesn't wear her CPAP  will snore . Pt states hypertension. Sleep study done 2021 and CPAP machine 2021    ESS:6 FSS:9    Objective:  Neurological Exam  Physical Exam Physical Examination:   Vitals:   09/18/22 0852  BP: (!) 145/81  Pulse: 89    General Examination: The patient is a very pleasant 56 y.o. female in no acute distress. She appears well-developed and well-nourished and well groomed.   HEENT: Normocephalic, atraumatic, pupils are equal, round and reactive to light, extraocular tracking is good without limitation to gaze excursion or nystagmus noted. Hearing is grossly intact. Face is symmetric with normal facial animation. Speech is clear with no dysarthria noted. There is no hypophonia. There is no lip, neck/head, jaw or voice tremor. Neck is supple with full range of passive and active motion. There are no  carotid bruits on auscultation. Oropharynx exam reveals: mild mouth dryness, adequate dental hygiene.  Neck circumference of 16-1/2 inches.  Chest: Clear to auscultation without wheezing, rhonchi or crackles noted.  Heart: S1+S2+0, regular and normal without murmurs, rubs or gallops noted.   Abdomen: Soft, non-tender and non-distended.  Extremities: There is trace pitting edema in the distal lower extremities bilaterally.   Skin: Warm and dry without trophic changes noted.   Musculoskeletal: exam reveals no obvious joint deformities.   Neurologically:  Mental status: The patient  is awake, alert and oriented in all 4 spheres. Her immediate and remote memory, attention, language skills and fund of knowledge are appropriate. There is no evidence of aphasia, agnosia, apraxia or anomia. Speech is clear with normal prosody and enunciation. Thought process is linear. Mood is normal and affect is normal.  Cranial nerves II - XII are as described above under HEENT exam.  Motor exam: Normal bulk, strength and tone is noted. There is no obvious action or resting tremor.  Fine motor skills and coordination: grossly intact.  Cerebellar testing: No dysmetria or intention tremor. There is no truncal or gait ataxia.  Sensory exam: intact to light touch in the upper and lower extremities.  Gait, station and balance: She stands easily. No veering to one side is noted. No leaning to one side is noted. Posture is age-appropriate and stance is narrow based. Gait shows normal stride length and normal pace. No problems turning are noted.   Assessment and Plan:   In summary, Jolie Brandow is a very pleasant 56 y.o.-year old female with an underlying medical history of hypertension, depression, hypothyroidism, back pain, reflux disease, and obesity, who presents for evaluation of her obstructive sleep apnea.  She was diagnosed with mild sleep apnea in 2021.  She has been on AutoPap therapy since then and reports  full compliance, and good results.  She has had some interim weight gain.  She is working on weight loss.  We will request a download from her DME provider.  She is up-to-date with her supplies and doing well, so long as her settings look good on the download she can follow-up once a year in our clinic.  She is transitioning from Dr. Freddie Apley office and is also in the process of finding a new primary care provider.   We talked about the importance of healthy lifestyle and weight loss and she is also advised to reduce her caffeine intake.  For now, we will plan to follow-up in 1 year, she can see one of our nurse practitioners routinely, we can offer her a video visit through [facility] as well.  I answered all her questions today and she was in agreement with our plan.

## 2022-09-18 NOTE — Patient Instructions (Signed)
Please continue using your autoPAP regularly. While your insurance requires that you use PAP at least 4 hours each night on 70% of the nights, I recommend, that you not skip any nights and use it throughout the night if you can. Getting used to PAP and staying with the treatment long term does take time and patience and discipline. Untreated obstructive sleep apnea when it is moderate to severe can have an adverse impact on cardiovascular health and raise her risk for heart disease, arrhythmias, hypertension, congestive heart failure, stroke and diabetes. Untreated obstructive sleep apnea causes sleep disruption, nonrestorative sleep, and sleep deprivation. This can have an impact on your day to day functioning and cause daytime sleepiness and impairment of cognitive function, memory loss, mood disturbance, and problems focussing. Using PAP regularly can improve these symptoms.   We can see you in 1 year, you can see one of our nurse practitioners as you are stable. 

## 2022-09-23 NOTE — Progress Notes (Signed)
Norman, Dickie La, RN; [place], Ashly PRINTED  THANKS     Previous Messages    ----- Message ----- From: [name], RN Sent: 09/19/2022  11:08 AM EST To: Kathy Norman; Kathy Norman; * Subject: tag to Dr. Boykin Reaper morning,  You faxed to [place] [facility] for PAP on this pt. Are you able to tag Dr. Frances Furbish to pt in [place].  Appreciate it.   Kathy Norman Female, 56 y.o., 07/08/1966 MRN: [id number]   Thank you,  Delmer Islam

## 2022-10-25 DIAGNOSIS — M25461 Effusion, right knee: Secondary | ICD-10-CM | POA: Diagnosis not present

## 2022-10-25 DIAGNOSIS — M25562 Pain in left knee: Secondary | ICD-10-CM | POA: Diagnosis not present

## 2022-10-25 DIAGNOSIS — M7989 Other specified soft tissue disorders: Secondary | ICD-10-CM | POA: Diagnosis not present

## 2022-10-25 DIAGNOSIS — I10 Essential (primary) hypertension: Secondary | ICD-10-CM | POA: Diagnosis not present

## 2022-10-25 DIAGNOSIS — G8929 Other chronic pain: Secondary | ICD-10-CM | POA: Diagnosis not present

## 2022-10-25 DIAGNOSIS — M25561 Pain in right knee: Secondary | ICD-10-CM | POA: Diagnosis not present

## 2022-10-25 DIAGNOSIS — E039 Hypothyroidism, unspecified: Secondary | ICD-10-CM | POA: Diagnosis not present

## 2022-10-25 DIAGNOSIS — Z79899 Other long term (current) drug therapy: Secondary | ICD-10-CM | POA: Diagnosis not present

## 2022-11-06 DIAGNOSIS — G4733 Obstructive sleep apnea (adult) (pediatric): Secondary | ICD-10-CM | POA: Diagnosis not present

## 2022-11-15 DIAGNOSIS — R051 Acute cough: Secondary | ICD-10-CM | POA: Diagnosis not present

## 2022-11-15 DIAGNOSIS — U071 COVID-19: Secondary | ICD-10-CM | POA: Diagnosis not present

## 2022-11-15 DIAGNOSIS — J029 Acute pharyngitis, unspecified: Secondary | ICD-10-CM | POA: Diagnosis not present

## 2023-01-27 DIAGNOSIS — G4733 Obstructive sleep apnea (adult) (pediatric): Secondary | ICD-10-CM | POA: Diagnosis not present

## 2023-02-05 ENCOUNTER — Encounter: Payer: Self-pay | Admitting: Family Medicine

## 2023-02-05 ENCOUNTER — Ambulatory Visit (INDEPENDENT_AMBULATORY_CARE_PROVIDER_SITE_OTHER): Payer: 59 | Admitting: Family Medicine

## 2023-02-05 VITALS — BP 129/81 | HR 99 | Temp 97.6°F | Ht 67.0 in | Wt 301.4 lb

## 2023-02-05 DIAGNOSIS — F320 Major depressive disorder, single episode, mild: Secondary | ICD-10-CM

## 2023-02-05 DIAGNOSIS — E559 Vitamin D deficiency, unspecified: Secondary | ICD-10-CM

## 2023-02-05 DIAGNOSIS — E782 Mixed hyperlipidemia: Secondary | ICD-10-CM | POA: Diagnosis not present

## 2023-02-05 DIAGNOSIS — Z6841 Body Mass Index (BMI) 40.0 and over, adult: Secondary | ICD-10-CM | POA: Insufficient documentation

## 2023-02-05 DIAGNOSIS — F5101 Primary insomnia: Secondary | ICD-10-CM | POA: Diagnosis not present

## 2023-02-05 DIAGNOSIS — N644 Mastodynia: Secondary | ICD-10-CM | POA: Diagnosis not present

## 2023-02-05 DIAGNOSIS — I10 Essential (primary) hypertension: Secondary | ICD-10-CM | POA: Diagnosis not present

## 2023-02-05 DIAGNOSIS — G4733 Obstructive sleep apnea (adult) (pediatric): Secondary | ICD-10-CM

## 2023-02-05 DIAGNOSIS — R7309 Other abnormal glucose: Secondary | ICD-10-CM | POA: Diagnosis not present

## 2023-02-05 LAB — CMP14+EGFR
Alkaline Phosphatase: 84 IU/L (ref 44–121)
BUN/Creatinine Ratio: 13 (ref 9–23)
CO2: 23 mmol/L (ref 20–29)
Creatinine, Ser: 0.94 mg/dL (ref 0.57–1.00)
Globulin, Total: 3 g/dL (ref 1.5–4.5)
eGFR: 71 mL/min/{1.73_m2} (ref >59)

## 2023-02-05 LAB — THYROID PANEL WITH TSH

## 2023-02-05 LAB — VITAMIN D 25 HYDROXY (VIT D DEFICIENCY, FRACTURES)

## 2023-02-05 LAB — LIPID PANEL: Chol/HDL Ratio: 3.8 ratio (ref 0.0–4.4)

## 2023-02-05 LAB — BAYER DCA HB A1C WAIVED: HB A1C (BAYER DCA - WAIVED): 5.6 % (ref 4.8–5.6)

## 2023-02-05 NOTE — Progress Notes (Signed)
Subjective:  Patient ID: Kathy Norman, female    DOB: [birth year], 57 y.o.   MRN: [id number]  Patient Care Team: Sonny Masters, FNP as PCP - General (Family Medicine)   Chief Complaint:  New Patient (Initial Visit) [facility] family medicine ), Establish Care, and Breast Pain (Left breast pain x 1 month.  States has pain 80% of the time. Had a breast clip but in x 1 year ago)   HPI: Kathy Norman is a 57 y.o. female presenting on 02/05/2023 for New Patient (Initial Visit) [facility] family medicine ), Establish Care, and Breast Pain (Left breast pain x 1 month.  States has pain 80% of the time. Had a breast clip but in x 1 year ago)   Pt presents today to establish care with new PCP. She was formerly followed by [facility] in [place], [place]. She has a history of acquired hypothyroidism, morbid obesity, hypertension, depression, Vit D deficiency, OSA on CPAP, and insomnia. She has been on her current medications for several years and tolerates them well. Denies adverse side effects. She does see a Veterinary surgeon at [facility]. States her depression is fairly controlled, denies SI or HI. She is on levothyroxine and denies hyper- or hypothyroid symptoms other than weight gain. On Vit D repletion therapy and denies arthralgias, trouble walking, or recent fractures. On statin therapy without associates myalgias. Compliant with blood pressure medications. Does report intermittent lower extremity swelling. Denies chest pain, leg swelling, weakness, confusion, headaches, or palpitations. Does watch salt intake. Does not exercise on a regular basis.  Her biggest concern today is left breast pain. She had a biopsy with a clip placed about 1 year ago. States over the last month she has had intermittent breast pain, worse with palpation. No erythema, skin changes, or drainage.       02/05/2023    8:56 AM 09/27/2016    8:48 AM 09/13/2016    3:23 PM  Depression screen PHQ 2/9  Decreased Interest 2 3 0  Down,  Depressed, Hopeless 0 2 0  PHQ - 2 Score 2 5 0  Altered sleeping 1 3   Tired, decreased energy 3 3   Change in appetite 3 1   Feeling bad or failure about yourself  0 2   Trouble concentrating 0 0   Moving slowly or fidgety/restless 0 2   Suicidal thoughts 0 0   PHQ-9 Score 9 16   Difficult doing work/chores Not difficult at all Somewhat difficult       02/05/2023    8:57 AM 09/27/2016    8:48 AM  GAD 7 : Generalized Anxiety Score  Nervous, Anxious, on Edge 0 2  Control/stop worrying 1 2  Worry too much - different things 1 2  Trouble relaxing 0 2  Restless 0 2  Easily annoyed or irritable 0 2  Afraid - awful might happen 0 2  Total GAD 7 Score 2 14  Anxiety Difficulty Not difficult at all Somewhat difficult       Relevant past medical, surgical, family, and social history reviewed and updated as indicated.  Allergies and medications reviewed and updated. Data reviewed: Chart in Epic.   Past Medical History:  Diagnosis Date   Depression    Hypertension     Past Surgical History:  Procedure Laterality Date   CESAREAN SECTION     GALLBLADDER SURGERY      Social History   Socioeconomic History   Marital status: Single  Spouse name: Not on file   Number of children: 1   Years of education: Not on file   Highest education level: Not on file  Occupational History   Not on file  Tobacco Use   Smoking status: Never   Smokeless tobacco: Never  Vaping Use   Vaping Use: Never used  Substance and Sexual Activity   Alcohol use: No   Drug use: No   Sexual activity: Yes  Other Topics Concern   Not on file  Social History Narrative   Not on file   Social Determinants of Health   Financial Resource Strain: Not on file  Food Insecurity: Not on file  Transportation Needs: Not on file  Physical Activity: Not on file  Stress: Not on file  Social Connections: Not on file  Intimate Partner Violence: Not on file    Outpatient Encounter Medications as of  02/05/2023  Medication Sig   amitriptyline (ELAVIL) 50 MG tablet Take 50 mg by mouth daily.   aspirin EC 81 MG tablet Take 1 tablet by mouth daily.   Cholecalciferol 125 MCG (5000 UT) TABS Take 1 tablet by mouth daily.   citalopram (CELEXA) 20 MG tablet Take 1 tablet (20 mg total) by mouth daily. (Patient taking differently: Take 20 mg by mouth 2 (two) times daily.)   levothyroxine (SYNTHROID) 75 MCG tablet Take 1 tablet by mouth daily.   lisinopril (PRINIVIL,ZESTRIL) 20 MG tablet Take 1 tablet (20 mg total) by mouth daily.   simvastatin (ZOCOR) 20 MG tablet Take 20 mg by mouth at bedtime.   [DISCONTINUED] amoxicillin (AMOXIL) 500 MG capsule Take 1 capsule (500 mg total) by mouth 2 (two) times daily.   [DISCONTINUED] meclizine (ANTIVERT) 12.5 MG tablet Take 1 tablet (12.5 mg total) by mouth 3 (three) times daily as needed for dizziness.   No facility-administered encounter medications on file as of 02/05/2023.    Allergies  Allergen Reactions   Sulfa Antibiotics Hives   Sulfamethoxazole-Trimethoprim Hives   Misc. Sulfonamide Containing Compounds Hives   Sertraline Rash   Sertraline Hcl Rash    Review of Systems  Constitutional:  Positive for activity change, appetite change, fatigue and unexpected weight change. Negative for chills, diaphoresis and fever.  Eyes:  Negative for photophobia and visual disturbance.  Respiratory:  Negative for cough and shortness of breath.   Cardiovascular:  Positive for leg swelling. Negative for chest pain and palpitations.  Gastrointestinal:  Negative for abdominal pain, diarrhea, nausea and vomiting.  Endocrine: Negative for cold intolerance, heat intolerance, polydipsia, polyphagia and polyuria.  Genitourinary:  Negative for decreased urine volume and difficulty urinating.  Neurological:  Negative for dizziness, tremors, seizures, syncope, facial asymmetry, speech difficulty, weakness, light-headedness, numbness and headaches.  Psychiatric/Behavioral:   Positive for sleep disturbance. Negative for agitation, behavioral problems, confusion, decreased concentration, dysphoric mood, hallucinations, self-injury and suicidal ideas. The patient is nervous/anxious. The patient is not hyperactive.   All other systems reviewed and are negative.       Objective:  BP 129/81   Pulse 99   Temp 97.6 F (36.4 C) (Temporal)   Ht 5\' 7"  (1.702 m)   Wt (!) 301 lb 6.4 oz (136.7 kg)   SpO2 94%   BMI 47.21 kg/m    Wt Readings from Last 3 Encounters:  02/05/23 (!) 301 lb 6.4 oz (136.7 kg)  09/18/22 296 lb 6.4 oz (134.4 kg)  08/28/20 281 lb (127.5 kg)    Physical Exam Vitals and nursing note reviewed.  Constitutional:  General: She is not in acute distress.    Appearance: Normal appearance. She is well-developed and well-groomed. She is morbidly obese. She is not ill-appearing, toxic-appearing or diaphoretic.  HENT:     Head: Normocephalic and atraumatic.     Jaw: There is normal jaw occlusion.     Right Ear: Hearing normal.     Left Ear: Hearing normal.     Nose: Nose normal.     Mouth/Throat:     Lips: Pink.     Mouth: Mucous membranes are moist.     Pharynx: Oropharynx is clear. Uvula midline.  Eyes:     General: Lids are normal.     Extraocular Movements: Extraocular movements intact.     Conjunctiva/sclera: Conjunctivae normal.     Pupils: Pupils are equal, round, and reactive to light.  Neck:     Thyroid: No thyroid mass, thyromegaly or thyroid tenderness.     Vascular: No carotid bruit or JVD.     Trachea: Trachea and phonation normal.  Cardiovascular:     Rate and Rhythm: Normal rate and regular rhythm.     Chest Wall: PMI is not displaced.     Pulses: Normal pulses.     Heart sounds: Normal heart sounds. No murmur heard.    No friction rub. No gallop.  Pulmonary:     Effort: Pulmonary effort is normal. No respiratory distress.     Breath sounds: Normal breath sounds. No wheezing.  Chest:     Chest wall: Tenderness  present.  Abdominal:     General: Bowel sounds are normal. There is no distension or abdominal bruit.     Palpations: Abdomen is soft. There is no hepatomegaly or splenomegaly.     Tenderness: There is no abdominal tenderness. There is no right CVA tenderness or left CVA tenderness.     Hernia: No hernia is present.  Musculoskeletal:        General: Normal range of motion.     Cervical back: Normal range of motion and neck supple.     Right lower leg: No edema.     Left lower leg: No edema.  Lymphadenopathy:     Cervical: No cervical adenopathy.  Skin:    General: Skin is warm and dry.     Capillary Refill: Capillary refill takes less than 2 seconds.     Coloration: Skin is not cyanotic, jaundiced or pale.     Findings: No rash.  Neurological:     General: No focal deficit present.     Mental Status: She is alert and oriented to person, place, and time.     Sensory: Sensation is intact.     Motor: Motor function is intact.     Coordination: Coordination is intact.     Gait: Gait is intact.     Deep Tendon Reflexes: Reflexes are normal and symmetric.  Psychiatric:        Attention and Perception: Attention and perception normal.        Mood and Affect: Mood and affect normal.        Speech: Speech normal.        Behavior: Behavior normal. Behavior is cooperative.        Thought Content: Thought content normal.        Cognition and Memory: Cognition and memory normal.        Judgment: Judgment normal.     Results for orders placed or performed in visit on 09/27/16  Urine culture  Specimen: Urine   URINE  Result Value Ref Range   Urine Culture, Routine Final report (A)    Organism ID, Bacteria Comment (A)   Microscopic Examination   URINE  Result Value Ref Range   WBC, UA 0-5 0 - 5 /hpf   RBC, UA 0-2 0 - 2 /hpf   Epithelial Cells (non renal) 0-10 0 - 10 /hpf   Renal Epithel, UA 0-10 (A) None seen /hpf   Bacteria, UA Few None seen/Few  Urinalysis, Complete  Result  Value Ref Range   Specific Gravity, UA >1.005 1.005 - 1.030   pH, UA 6.0 5.0 - 7.5   Color, UA Yellow Yellow   Appearance Ur Clear Clear   Leukocytes, UA Negative Negative   Protein, UA Negative Negative/Trace   Glucose, UA Negative Negative   Ketones, UA Negative Negative   RBC, UA Trace (A) Negative   Bilirubin, UA Negative Negative   Urobilinogen, Ur 0.2 0.2 - 1.0 mg/dL   Nitrite, UA Negative Negative   Microscopic Examination See below:        Pertinent labs & imaging results that were available during my care of the patient were reviewed by me and considered in my medical decision making.  Assessment & Plan:  Kathy Norman was seen today for new patient (initial visit), establish care and breast pain.  Diagnoses and all orders for this visit:  Primary hypertension BP well controlled. Changes were not made in regimen today. Goal BP is 130/80. Pt aware to report any persistent high or low readings. DASH diet and exercise encouraged. Exercise at least 150 minutes per week and increase as tolerated. Goal BMI > 25. Stress management encouraged. Avoid nicotine and tobacco product use. Avoid excessive alcohol and NSAID's. Avoid more than 2000 mg of sodium daily. Medications as prescribed. Follow up as scheduled.  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 Will recheck thyroid function.  -     Thyroid Panel With TSH  Mixed hyperlipidemia Diet encouraged - increase intake of fresh fruits and vegetables, increase intake of lean proteins. Bake, broil, or grill foods. Avoid fried, greasy, and fatty foods. Avoid fast foods. Increase intake of fiber-rich whole grains. Exercise encouraged - at least 150 minutes per week and advance as tolerated.  Goal BMI < 25. Continue medications as prescribed. Follow up in 3-6 months as discussed.   -     CMP14+EGFR -     Lipid panel  Morbid obesity Diet and exercise encouraged, labs pending.  -     CBC with Differential/Platelet -     CMP14+EGFR -     Lipid panel -     Thyroid Panel With TSH -     Bayer DCA Hb A1c Waived  OSA (obstructive sleep apnea) Doing well on current regimen.   Vitamin D deficiency Labs pending. Continue repletion therapy. If indicated, will change repletion dosage. Eat foods rich in Vit D including milk, orange juice, yogurt with vitamin D added, salmon or mackerel, canned tuna fish, cereals with vitamin D added, and cod liver oil. Get out in the sun but make sure to wear at least SPF 30 sunscreen.  -     VITAMIN D 25 Hydroxy (Vit-D Deficiency, Fractures)  Primary insomnia Doing well on current regimen.  Will check thyroid function.  -     Thyroid Panel With TSH  Breast pain, left Will obtain diagnostic imaging.  -     US BREAST COMPLETE UNI LEFT INC AXILLA; Future -     MM Digital Diagnostic Unilat L; Future     Continue all other maintenance medications.  Follow up plan: Return in about 8 weeks (around 04/02/2023), or if symptoms worsen or fail to improve, for BMI.   Continue healthy lifestyle choices, including diet (rich in fruits, vegetables, and lean proteins, and low in salt and simple carbohydrates) and exercise (at least 30 minutes of moderate physical activity daily).  Educational handout given for calorie counting for weight loss  The above assessment and management plan was discussed with the patient. The patient verbalized understanding of and has agreed to the management plan. Patient is aware to call the clinic if they develop any new symptoms or if symptoms persist or worsen. Patient is aware when to return to the clinic for a follow-up visit. Patient educated on when it is appropriate to go to the emergency department.   Kari Baars, FNP-C [facility] [phone]

## 2023-02-05 NOTE — Patient Instructions (Signed)
Here is a guide to help us find out which weight loss medications will be covered by your insurance plan.  Please check out this web site  [email or web address] and follow the instructions.    Wegovy or Zepbound   There is also a phone number you can call if you do not have access to the Internet. Call [phone] (Monday- Friday 8am-8pm) and an associate will help navigate you.   [employer] provides coverage information for more than 80% of the inquiries submitted!!  

## 2023-02-06 ENCOUNTER — Other Ambulatory Visit: Payer: Self-pay

## 2023-02-06 DIAGNOSIS — N644 Mastodynia: Secondary | ICD-10-CM

## 2023-02-06 LAB — LIPID PANEL
Cholesterol, Total: 163 mg/dL (ref 100–199)
HDL: 43 mg/dL (ref >39)
LDL Chol Calc (NIH): 93 mg/dL (ref 0–99)
Triglycerides: 153 mg/dL — ABNORMAL HIGH (ref 0–149)
VLDL Cholesterol Cal: 27 mg/dL (ref 5–40)

## 2023-02-06 LAB — CMP14+EGFR
ALT: 31 IU/L (ref 0–32)
AST: 33 IU/L (ref 0–40)
Albumin/Globulin Ratio: 1.4 (ref 1.2–2.2)
Albumin: 4.1 g/dL (ref 3.8–4.9)
BUN: 12 mg/dL (ref 6–24)
Bilirubin Total: 0.4 mg/dL (ref 0.0–1.2)
Calcium: 10 mg/dL (ref 8.7–10.2)
Chloride: 102 mmol/L (ref 96–106)
Glucose: 130 mg/dL — ABNORMAL HIGH (ref 70–99)
Potassium: 4.3 mmol/L (ref 3.5–5.2)
Sodium: 140 mmol/L (ref 134–144)
Total Protein: 7.1 g/dL (ref 6.0–8.5)

## 2023-02-06 LAB — CBC WITH DIFFERENTIAL/PLATELET
Basophils Absolute: 0 10*3/uL (ref 0.0–0.2)
Basos: 0 %
EOS (ABSOLUTE): 0.2 10*3/uL (ref 0.0–0.4)
Eos: 3 %
Hematocrit: 42.4 % (ref 34.0–46.6)
Hemoglobin: 14.2 g/dL (ref 11.1–15.9)
Immature Grans (Abs): 0 10*3/uL (ref 0.0–0.1)
Immature Granulocytes: 1 %
Lymphocytes Absolute: 1.9 10*3/uL (ref 0.7–3.1)
Lymphs: 27 %
MCH: 31.8 pg (ref 26.6–33.0)
MCHC: 33.5 g/dL (ref 31.5–35.7)
MCV: 95 fL (ref 79–97)
Monocytes Absolute: 0.7 10*3/uL (ref 0.1–0.9)
Monocytes: 9 %
Neutrophils Absolute: 4.4 10*3/uL (ref 1.4–7.0)
Neutrophils: 60 %
Platelets: 244 10*3/uL (ref 150–450)
RBC: 4.46 x10E6/uL (ref 3.77–5.28)
RDW: 12 % (ref 11.7–15.4)
WBC: 7.2 10*3/uL (ref 3.4–10.8)

## 2023-02-06 LAB — THYROID PANEL WITH TSH: T3 Uptake Ratio: 23 % — ABNORMAL LOW (ref 24–39)

## 2023-02-25 ENCOUNTER — Telehealth: Payer: Self-pay | Admitting: Family Medicine

## 2023-02-25 NOTE — Telephone Encounter (Signed)
Contacted Kathy Norman to schedule their annual wellness visit. Appointment made for 03/07/2023.   Thank you,  Judeth Cornfield,  AMB [facility] Direct Dial ??[phone]

## 2023-03-05 DIAGNOSIS — R92313 Mammographic fatty tissue density, bilateral breasts: Secondary | ICD-10-CM | POA: Diagnosis not present

## 2023-03-05 DIAGNOSIS — N644 Mastodynia: Secondary | ICD-10-CM | POA: Diagnosis not present

## 2023-03-05 LAB — HM MAMMOGRAPHY

## 2023-03-07 ENCOUNTER — Encounter: Payer: Self-pay | Admitting: Family Medicine

## 2023-03-07 ENCOUNTER — Ambulatory Visit (INDEPENDENT_AMBULATORY_CARE_PROVIDER_SITE_OTHER): Payer: 59

## 2023-03-07 VITALS — Ht 67.0 in | Wt 300.0 lb

## 2023-03-07 DIAGNOSIS — Z0000 Encounter for general adult medical examination without abnormal findings: Secondary | ICD-10-CM

## 2023-03-07 NOTE — Progress Notes (Signed)
Subjective:   Kathy Norman is a 57 y.o. female who presents for an Initial Medicare Annual Wellness Visit. I connected with  Kathy Norman on 03/07/23 by a audio enabled telemedicine application and verified that I am speaking with the correct person using two identifiers.  Patient Location: Home  Provider Location: Home Office  I discussed the limitations of evaluation and management by telemedicine. The patient expressed understanding and agreed to proceed.  Review of Systems     Cardiac Risk Factors include: advanced age (>24men, >90 women);hypertension;dyslipidemia     Objective:    Today's Vitals   03/07/23 1118  Weight: 300 lb (136.1 kg)  Height: 5\' 7"  (1.702 m)   Body mass index is 46.99 kg/m.     03/07/2023   11:21 AM  Advanced Directives  Does Patient Have a Medical Advance Directive? No  Would patient like information on creating a medical advance directive? No - Patient declined    Current Medications (verified) Outpatient Encounter Medications as of 03/07/2023  Medication Sig   amitriptyline (ELAVIL) 50 MG tablet Take 50 mg by mouth daily.   aspirin EC 81 MG tablet Take 1 tablet by mouth daily.   Cholecalciferol 125 MCG (5000 UT) TABS Take 1 tablet by mouth daily.   citalopram (CELEXA) 20 MG tablet Take 1 tablet (20 mg total) by mouth daily. (Patient taking differently: Take 20 mg by mouth 2 (two) times daily.)   levothyroxine (SYNTHROID) 75 MCG tablet Take 1 tablet by mouth daily.   lisinopril (PRINIVIL,ZESTRIL) 20 MG tablet Take 1 tablet (20 mg total) by mouth daily.   simvastatin (ZOCOR) 20 MG tablet Take 20 mg by mouth at bedtime.   No facility-administered encounter medications on file as of 03/07/2023.    Allergies (verified) Sulfa antibiotics, Sulfamethoxazole-trimethoprim, Misc. sulfonamide containing compounds, Sertraline, and Sertraline hcl   History: Past Medical History:  Diagnosis Date   Depression    Hypertension    Past Surgical  History:  Procedure Laterality Date   CESAREAN SECTION     GALLBLADDER SURGERY     Family History  Problem Relation Age of Onset   Heart disease Mother    COPD Mother    COPD Sister    Skin cancer Sister    Throat cancer Brother    Breast cancer Maternal Aunt    Throat cancer Maternal Uncle    Breast cancer Maternal Grandmother    Sleep apnea Nephew    Thyroid cancer Nephew    Sleep apnea Niece    Social History   Socioeconomic History   Marital status: Single    Spouse name: Not on file   Number of children: 1   Years of education: Not on file   Highest education level: Not on file  Occupational History   Not on file  Tobacco Use   Smoking status: Never   Smokeless tobacco: Never  Vaping Use   Vaping Use: Never used  Substance and Sexual Activity   Alcohol use: No   Drug use: No   Sexual activity: Yes  Other Topics Concern   Not on file  Social History Narrative   Not on file   Social Determinants of Health   Financial Resource Strain: Low Risk  (03/07/2023)   Overall Financial Resource Strain (CARDIA)    Difficulty of Paying Living Expenses: Not hard at all  Food Insecurity: No Food Insecurity (03/07/2023)   Hunger Vital Sign    Worried About Running Out of Food in the Last  Year: Never true    Ran Out of Food in the Last Year: Never true  Transportation Needs: No Transportation Needs (03/07/2023)   PRAPARE - Administrator, Civil Service (Medical): No    Lack of Transportation (Non-Medical): No  Physical Activity: Sufficiently Active (03/07/2023)   Exercise Vital Sign    Days of Exercise per Week: 7 days    Minutes of Exercise per Session: 30 min  Stress: No Stress Concern Present (03/07/2023)   [employer] of Occupational Health - Occupational Stress Questionnaire    Feeling of Stress : Not at all  Social Connections: Socially Isolated (03/07/2023)   Social Connection and Isolation Panel [NHANES]    Frequency of Communication with Friends  and Family: More than three times a week    Frequency of Social Gatherings with Friends and Family: More than three times a week    Attends Religious Services: Never    Database administrator or Organizations: No    Attends Engineer, structural: Never    Marital Status: Never married    Tobacco Counseling Counseling given: Not Answered   Clinical Intake:  Pre-visit preparation completed: Yes  Pain : No/denies pain     Nutritional Risks: None Diabetes: No  How often do you need to have someone help you when you read instructions, pamphlets, or other written materials from your doctor or pharmacy?: 1 - Never  Diabetic?no   Interpreter Needed?: No  Information entered by :: Renie Ora, LPN   Activities of Daily Living    03/07/2023   11:21 AM 03/03/2023    9:27 AM  In your present state of health, do you have any difficulty performing the following activities:  Hearing? 0 0  Vision? 0 0  Difficulty concentrating or making decisions? 0 0  Walking or climbing stairs? 0 0  Dressing or bathing? 0 0  Doing errands, shopping? 0 0  Preparing Food and eating ? N N  Using the Toilet? N N  In the past six months, have you accidently leaked urine? N N  Do you have problems with loss of bowel control? N N  Managing your Medications? N N  Managing your Finances? N N  Housekeeping or managing your Housekeeping? N N    Patient Care Team: Sonny Masters, FNP as PCP - General (Family Medicine)  Indicate any recent Medical Services you may have received from other than [facility] providers in the past year (date may be approximate).     Assessment:   This is a routine wellness examination for [place].  Hearing/Vision screen Vision Screening - Comments:: Wears rx glasses - up to date with routine eye exams with  Dr.Lee   Dietary issues and exercise activities discussed: Current Exercise Habits: Home exercise routine, Type of exercise: walking, Time (Minutes): 30,  Frequency (Times/Week): 7, Weekly Exercise (Minutes/Week): 210, Intensity: Mild, Exercise limited by: orthopedic condition(s)   Goals Addressed             This Visit's Progress    Exercise 3x per week (30 min per time)         Depression Screen    02/05/2023    8:56 AM 09/27/2016    8:48 AM 09/13/2016    3:23 PM  PHQ 2/9 Scores  PHQ - 2 Score 2 5 0  PHQ- 9 Score 9 16     Fall Risk    03/07/2023   11:19 AM 03/03/2023    9:27 AM 02/05/2023  8:56 AM 05/04/2020    3:18 PM 09/27/2016    8:48 AM  Fall Risk   Falls in the past year? 0 0 0 0 No  Number falls in past yr: 0 0     Injury with Fall? 0 0     Risk for fall due to : No Fall Risks      Follow up Falls prevention discussed        FALL RISK PREVENTION PERTAINING TO THE HOME:  Any stairs in or around the home? No  If so, are there any without handrails? No  Home free of loose throw rugs in walkways, pet beds, electrical cords, etc? Yes  Adequate lighting in your home to reduce risk of falls? Yes   ASSISTIVE DEVICES UTILIZED TO PREVENT FALLS:  Life alert? No  Use of a cane, walker or w/c? No  Grab bars in the bathroom? Yes  Shower chair or bench in shower? Yes  Elevated toilet seat or a handicapped toilet? Yes           03/07/2023   11:21 AM  6CIT Screen  What Year? 0 points  What month? 0 points  What time? 0 points  Count back from 20 0 points  Months in reverse 0 points  Repeat phrase 0 points  Total Score 0 points    Immunizations Immunization History  Administered Date(s) Administered   Moderna Sars-Covid-2 Vaccination 07/18/2020, 08/14/2020, 02/19/2021   Tdap 05/19/2012    TDAP status: Due, Education has been provided regarding the importance of this vaccine. Advised may receive this vaccine at local pharmacy or Health Dept. Aware to provide a copy of the vaccination record if obtained from local pharmacy or Health Dept. Verbalized acceptance and understanding.  Flu Vaccine status:  Declined, Education has been provided regarding the importance of this vaccine but patient still declined. Advised may receive this vaccine at local pharmacy or Health Dept. Aware to provide a copy of the vaccination record if obtained from local pharmacy or Health Dept. Verbalized acceptance and understanding.  Pneumococcal vaccine status: Due, Education has been provided regarding the importance of this vaccine. Advised may receive this vaccine at local pharmacy or Health Dept. Aware to provide a copy of the vaccination record if obtained from local pharmacy or Health Dept. Verbalized acceptance and understanding.  Covid-19 vaccine status: Declined, Education has been provided regarding the importance of this vaccine but patient still declined. Advised may receive this vaccine at local pharmacy or Health Dept.or vaccine clinic. Aware to provide a copy of the vaccination record if obtained from local pharmacy or Health Dept. Verbalized acceptance and understanding.  Qualifies for Shingles Vaccine? No   Zostavax completed No   Shingrix Completed?: No.    Education has been provided regarding the importance of this vaccine. Patient has been advised to call insurance company to determine out of pocket expense if they have not yet received this vaccine. Advised may also receive vaccine at local pharmacy or Health Dept. Verbalized acceptance and understanding.  Screening Tests Health Maintenance  Topic Date Due   Zoster Vaccines- Shingrix (1 of 2) Never done   PAP SMEAR-Modifier  Never done   Colonoscopy  07/22/2012   MAMMOGRAM  06/21/2017   DTaP/Tdap/Td (2 - Td or Tdap) 05/19/2022   COVID-19 Vaccine (4 - 2023-24 season) 06/14/2022   Hepatitis C Screening  02/05/2024 (Originally 05/31/1984)   HIV Screening  02/05/2024 (Originally [birth year]1981)   INFLUENZA VACCINE  05/15/2023   Medicare Annual Wellness (AWV)  03/06/2024   HPV VACCINES  Aged Out    Health Maintenance  Health Maintenance Due  Topic  Date Due   Zoster Vaccines- Shingrix (1 of 2) Never done   PAP SMEAR-Modifier  Never done   Colonoscopy  07/22/2012   MAMMOGRAM  06/21/2017   DTaP/Tdap/Td (2 - Td or Tdap) 05/19/2022   COVID-19 Vaccine (4 - 2023-24 season) 06/14/2022    Colorectal cancer screening: Type of screening: Colonoscopy. Completed 08/08/2019. Repeat every 5 years  Mammogram status: Completed 03/05/2023. Repeat every year  Bone Density status: Ordered not of age . Pt provided with contact info and advised to call to schedule appt.  Lung Cancer Screening: (Low Dose CT Chest recommended if Age 87-80 years, 30 pack-year currently smoking OR have quit w/in 15years.) does not qualify.   Lung Cancer Screening Referral: n/a  Additional Screening:  Hepatitis C Screening: does not qualify;   Vision Screening: Recommended annual ophthalmology exams for early detection of glaucoma and other disorders of the eye. Is the patient up to date with their annual eye exam?  Yes  Who is the provider or what is the name of the office in which the patient attends annual eye exams? Dr.Lee  If pt is not established with a provider, would they like to be referred to a provider to establish care? No .   Dental Screening: Recommended annual dental exams for proper oral hygiene  Community Resource Referral / Chronic Care Management: CRR required this visit?  No   CCM required this visit?  No      Plan:     I have personally reviewed and noted the following in the patient's chart:   Medical and social history Use of alcohol, tobacco or illicit drugs  Current medications and supplements including opioid prescriptions. Patient is not currently taking opioid prescriptions. Functional ability and status Nutritional status Physical activity Advanced directives List of other physicians Hospitalizations, surgeries, and ER visits in previous 12 months Vitals Screenings to include cognitive, depression, and falls Referrals and  appointments  In addition, I have reviewed and discussed with patient certain preventive protocols, quality metrics, and best practice recommendations. A written personalized care plan for preventive services as well as general preventive health recommendations were provided to patient.     Lorrene Reid, LPN   [id number]   Nurse Notes: none

## 2023-03-07 NOTE — Patient Instructions (Signed)
[name] , Thank you for taking time to come for your Medicare Wellness Visit. I appreciate your ongoing commitment to your health goals. Please review the following plan we discussed and let me know if I can assist you in the future.   These are the goals we discussed:  Goals      Exercise 3x per week (30 min per time)        This is a list of the screening recommended for you and due dates:  Health Maintenance  Topic Date Due   Zoster (Shingles) Vaccine (1 of 2) Never done   Pap Smear  Never done   Colon Cancer Screening  07/22/2012   Mammogram  06/21/2017   DTaP/Tdap/Td vaccine (2 - Td or Tdap) 05/19/2022   COVID-19 Vaccine (4 - 2023-24 season) 06/14/2022   Hepatitis C Screening  02/05/2024*   HIV Screening  02/05/2024*   Flu Shot  05/15/2023   Medicare Annual Wellness Visit  03/06/2024   HPV Vaccine  Aged Out  *Topic was postponed. The date shown is not the original due date.    Advanced directives: Advance directive discussed with you today. I have provided a copy for you to complete at home and have notarized. Once this is complete please bring a copy in to our office so we can scan it into your chart.   Conditions/risks identified: Aim for 30 minutes of exercise or brisk walking, 6-8 glasses of water, and 5 servings of fruits and vegetables each day.   Next appointment: Follow up in one year for your annual wellness visit.   Preventive Care 40-64 Years, Female Preventive care refers to lifestyle choices and visits with your health care provider that can promote health and wellness. What does preventive care include? A yearly physical exam. This is also called an annual well check. Dental exams once or twice a year. Routine eye exams. Ask your health care provider how often you should have your eyes checked. Personal lifestyle choices, including: Daily care of your teeth and gums. Regular physical activity. Eating a healthy diet. Avoiding tobacco and drug  use. Limiting alcohol use. Practicing safe sex. Taking low-dose aspirin daily starting at age 2. Taking vitamin and mineral supplements as recommended by your health care provider. What happens during an annual well check? The services and screenings done by your health care provider during your annual well check will depend on your age, overall health, lifestyle risk factors, and family history of disease. Counseling  Your health care provider may ask you questions about your: Alcohol use. Tobacco use. Drug use. Emotional well-being. Home and relationship well-being. Sexual activity. Eating habits. Work and work Astronomer. Method of birth control. Menstrual cycle. Pregnancy history. Screening  You may have the following tests or measurements: Height, weight, and BMI. Blood pressure. Lipid and cholesterol levels. These may be checked every 5 years, or more frequently if you are over 46 years old. Skin check. Lung cancer screening. You may have this screening every year starting at age 62 if you have a 30-pack-year history of smoking and currently smoke or have quit within the past 15 years. Fecal occult blood test (FOBT) of the stool. You may have this test every year starting at age 35. Flexible sigmoidoscopy or colonoscopy. You may have a sigmoidoscopy every 5 years or a colonoscopy every 10 years starting at age 81. Hepatitis C blood test. Hepatitis B blood test. Sexually transmitted disease (STD) testing. Diabetes screening. This is done by checking your  blood sugar (glucose) after you have not eaten for a while (fasting). You may have this done every 1-3 years. Mammogram. This may be done every 1-2 years. Talk to your health care provider about when you should start having regular mammograms. This may depend on whether you have a family history of breast cancer. BRCA-related cancer screening. This may be done if you have a family history of breast, ovarian, tubal, or  peritoneal cancers. Pelvic exam and Pap test. This may be done every 3 years starting at age 13. Starting at age 103, this may be done every 5 years if you have a Pap test in combination with an HPV test. Bone density scan. This is done to screen for osteoporosis. You may have this scan if you are at high risk for osteoporosis. Discuss your test results, treatment options, and if necessary, the need for more tests with your health care provider. Vaccines  Your health care provider may recommend certain vaccines, such as: Influenza vaccine. This is recommended every year. Tetanus, diphtheria, and acellular pertussis (Tdap, Td) vaccine. You may need a Td booster every 10 years. Zoster vaccine. You may need this after age 72. Pneumococcal 13-valent conjugate (PCV13) vaccine. You may need this if you have certain conditions and were not previously vaccinated. Pneumococcal polysaccharide (PPSV23) vaccine. You may need one or two doses if you smoke cigarettes or if you have certain conditions. Talk to your health care provider about which screenings and vaccines you need and how often you need them. This information is not intended to replace advice given to you by your health care provider. Make sure you discuss any questions you have with your health care provider. Document Released: 10/27/2015 Document Revised: 06/19/2016 Document Reviewed: 08/01/2015 Elsevier Interactive Patient Education  2017 [employer].    Fall Prevention in the Home Falls can cause injuries. They can happen to people of all ages. There are many things you can do to make your home safe and to help prevent falls. What can I do on the outside of my home? Regularly fix the edges of walkways and driveways and fix any cracks. Remove anything that might make you trip as you walk through a door, such as a raised step or threshold. Trim any bushes or trees on the path to your home. Use bright outdoor lighting. Clear any walking  paths of anything that might make someone trip, such as rocks or tools. Regularly check to see if handrails are loose or broken. Make sure that both sides of any steps have handrails. Any raised decks and porches should have guardrails on the edges. Have any leaves, snow, or ice cleared regularly. Use sand or salt on walking paths during winter. Clean up any spills in your garage right away. This includes oil or grease spills. What can I do in the bathroom? Use night lights. Install grab bars by the toilet and in the tub and shower. Do not use towel bars as grab bars. Use non-skid mats or decals in the tub or shower. If you need to sit down in the shower, use a plastic, non-slip stool. Keep the floor dry. Clean up any water that spills on the floor as soon as it happens. Remove soap buildup in the tub or shower regularly. Attach bath mats securely with double-sided non-slip rug tape. Do not have throw rugs and other things on the floor that can make you trip. What can I do in the bedroom? Use night lights. Make sure that  you have a light by your bed that is easy to reach. Do not use any sheets or blankets that are too big for your bed. They should not hang down onto the floor. Have a firm chair that has side arms. You can use this for support while you get dressed. Do not have throw rugs and other things on the floor that can make you trip. What can I do in the kitchen? Clean up any spills right away. Avoid walking on wet floors. Keep items that you use a lot in easy-to-reach places. If you need to reach something above you, use a strong step stool that has a grab bar. Keep electrical cords out of the way. Do not use floor polish or wax that makes floors slippery. If you must use wax, use non-skid floor wax. Do not have throw rugs and other things on the floor that can make you trip. What can I do with my stairs? Do not leave any items on the stairs. Make sure that there are handrails  on both sides of the stairs and use them. Fix handrails that are broken or loose. Make sure that handrails are as long as the stairways. Check any carpeting to make sure that it is firmly attached to the stairs. Fix any carpet that is loose or worn. Avoid having throw rugs at the top or bottom of the stairs. If you do have throw rugs, attach them to the floor with carpet tape. Make sure that you have a light switch at the top of the stairs and the bottom of the stairs. If you do not have them, ask someone to add them for you. What else can I do to help prevent falls? Wear shoes that: Do not have high heels. Have rubber bottoms. Are comfortable and fit you well. Are closed at the toe. Do not wear sandals. If you use a stepladder: Make sure that it is fully opened. Do not climb a closed stepladder. Make sure that both sides of the stepladder are locked into place. Ask someone to hold it for you, if possible. Clearly mark and make sure that you can see: Any grab bars or handrails. First and last steps. Where the edge of each step is. Use tools that help you move around (mobility aids) if they are needed. These include: Canes. Walkers. Scooters. Crutches. Turn on the lights when you go into a dark area. Replace any light bulbs as soon as they burn out. Set up your furniture so you have a clear path. Avoid moving your furniture around. If any of your floors are uneven, fix them. If there are any pets around you, be aware of where they are. Review your medicines with your doctor. Some medicines can make you feel dizzy. This can increase your chance of falling. Ask your doctor what other things that you can do to help prevent falls. This information is not intended to replace advice given to you by your health care provider. Make sure you discuss any questions you have with your health care provider. Document Released: 07/27/2009 Document Revised: 03/07/2016 Document Reviewed:  11/04/2014 Elsevier Interactive Patient Education  2017 [employer].

## 2023-04-01 ENCOUNTER — Encounter: Payer: Self-pay | Admitting: Family Medicine

## 2023-04-01 ENCOUNTER — Ambulatory Visit (INDEPENDENT_AMBULATORY_CARE_PROVIDER_SITE_OTHER): Payer: 59 | Admitting: Family Medicine

## 2023-04-01 DIAGNOSIS — Z1211 Encounter for screening for malignant neoplasm of colon: Secondary | ICD-10-CM | POA: Diagnosis not present

## 2023-04-01 DIAGNOSIS — Z6841 Body Mass Index (BMI) 40.0 and over, adult: Secondary | ICD-10-CM

## 2023-04-01 DIAGNOSIS — Z1212 Encounter for screening for malignant neoplasm of rectum: Secondary | ICD-10-CM | POA: Diagnosis not present

## 2023-04-01 DIAGNOSIS — Z7689 Persons encountering health services in other specified circumstances: Secondary | ICD-10-CM

## 2023-04-01 MED ORDER — SEMAGLUTIDE-WEIGHT MANAGEMENT 0.5 MG/0.5ML ~~LOC~~ SOAJ
0.5000 mg | SUBCUTANEOUS | 0 refills | Status: AC
Start: 2023-04-30 — End: 2023-05-28

## 2023-04-01 MED ORDER — SEMAGLUTIDE-WEIGHT MANAGEMENT 1 MG/0.5ML ~~LOC~~ SOAJ
1.0000 mg | SUBCUTANEOUS | 0 refills | Status: AC
Start: 2023-05-29 — End: 2023-06-26

## 2023-04-01 MED ORDER — SEMAGLUTIDE-WEIGHT MANAGEMENT 0.25 MG/0.5ML ~~LOC~~ SOAJ
0.2500 mg | SUBCUTANEOUS | 0 refills | Status: AC
Start: 2023-04-01 — End: 2023-04-29

## 2023-04-01 NOTE — Progress Notes (Signed)
Subjective:  Patient ID: Kathy Norman, female    DOB: June 14, 1966, 57 y.o.   MRN: [id number]  Patient Care Team: Sonny Masters, FNP as PCP - General (Family Medicine)   Chief Complaint:  Medical Management of Chronic Issues (8 week follow up.  Insurance will pay for wegovy )   HPI: Kathy Norman is a 57 y.o. female presenting on 04/01/2023 for Medical Management of Chronic Issues (8 week follow up.  Insurance will pay for wegovy )   Pt presents today for weight management follow up. She has been doing well with diet and exercise. Has lost 2.5 lbs since last visit with diet and exercise alone. Insurance will cover [employer].    Relevant past medical, surgical, family, and social history reviewed and updated as indicated.  Allergies and medications reviewed and updated. Data reviewed: Chart in Epic.   Past Medical History:  Diagnosis Date   Depression    Hypertension     Past Surgical History:  Procedure Laterality Date   CESAREAN SECTION     GALLBLADDER SURGERY      Social History   Socioeconomic History   Marital status: Single    Spouse name: Not on file   Number of children: 1   Years of education: Not on file   Highest education level: 12th grade  Occupational History   Not on file  Tobacco Use   Smoking status: Never   Smokeless tobacco: Never  Vaping Use   Vaping Use: Never used  Substance and Sexual Activity   Alcohol use: No   Drug use: No   Sexual activity: Yes  Other Topics Concern   Not on file  Social History Narrative   Not on file   Social Determinants of Health   Financial Resource Strain: Low Risk  (03/28/2023)   Overall Financial Resource Strain (CARDIA)    Difficulty of Paying Living Expenses: Not hard at all  Food Insecurity: No Food Insecurity (03/28/2023)   Hunger Vital Sign    Worried About Running Out of Food in the Last Year: Never true    Ran Out of Food in the Last Year: Never true  Transportation Needs: No Transportation  Needs (03/28/2023)   PRAPARE - Administrator, Civil Service (Medical): No    Lack of Transportation (Non-Medical): No  Physical Activity: Insufficiently Active (03/28/2023)   Exercise Vital Sign    Days of Exercise per Week: 4 days    Minutes of Exercise per Session: 30 min  Stress: Stress Concern Present (03/28/2023)   [employer] of Occupational Health - Occupational Stress Questionnaire    Feeling of Stress : To some extent  Social Connections: Socially Isolated (03/28/2023)   Social Connection and Isolation Panel [NHANES]    Frequency of Communication with Friends and Family: More than three times a week    Frequency of Social Gatherings with Friends and Family: Once a week    Attends Religious Services: Never    Database administrator or Organizations: No    Attends Banker Meetings: Never    Marital Status: Never married  Intimate Partner Violence: Not At Risk (03/07/2023)   Humiliation, Afraid, Rape, and Kick questionnaire    Fear of Current or Ex-Partner: No    Emotionally Abused: No    Physically Abused: No    Sexually Abused: No    Outpatient Encounter Medications as of 04/01/2023  Medication Sig   amitriptyline (ELAVIL) 50 MG tablet Take 50  mg by mouth daily.   aspirin EC 81 MG tablet Take 1 tablet by mouth daily.   Cholecalciferol 125 MCG (5000 UT) TABS Take 1 tablet by mouth daily.   citalopram (CELEXA) 20 MG tablet Take 1 tablet (20 mg total) by mouth daily. (Patient taking differently: Take 20 mg by mouth 2 (two) times daily.)   levothyroxine (SYNTHROID) 75 MCG tablet Take 1 tablet by mouth daily.   lisinopril (PRINIVIL,ZESTRIL) 20 MG tablet Take 1 tablet (20 mg total) by mouth daily.   Semaglutide-Weight Management 0.25 MG/0.5ML SOAJ Inject 0.25 mg into the skin once a week for 28 days.   [START ON 04/30/2023] Semaglutide-Weight Management 0.5 MG/0.5ML SOAJ Inject 0.5 mg into the skin once a week for 28 days.   [START ON 05/29/2023]  Semaglutide-Weight Management 1 MG/0.5ML SOAJ Inject 1 mg into the skin once a week for 28 days.   simvastatin (ZOCOR) 20 MG tablet Take 20 mg by mouth at bedtime.   No facility-administered encounter medications on file as of 04/01/2023.    Allergies  Allergen Reactions   Sulfa Antibiotics Hives   Sulfamethoxazole-Trimethoprim Hives   Misc. Sulfonamide Containing Compounds Hives   Sertraline Rash   Sertraline Hcl Rash    Review of Systems  Constitutional:  Negative for activity change, appetite change, chills, diaphoresis, fatigue, fever and unexpected weight change.  HENT: Negative.    Eyes: Negative.  Negative for photophobia and visual disturbance.  Respiratory:  Negative for cough, chest tightness and shortness of breath.   Cardiovascular:  Negative for chest pain, palpitations and leg swelling.  Gastrointestinal:  Negative for abdominal pain, blood in stool, constipation, diarrhea, nausea and vomiting.  Endocrine: Negative.  Negative for polydipsia, polyphagia and polyuria.  Genitourinary:  Negative for decreased urine volume, difficulty urinating, dysuria, frequency and urgency.  Musculoskeletal:  Negative for arthralgias and myalgias.  Skin: Negative.   Allergic/Immunologic: Negative.   Neurological:  Negative for dizziness, tremors, seizures, syncope, facial asymmetry, speech difficulty, weakness, light-headedness, numbness and headaches.  Hematological: Negative.   Psychiatric/Behavioral:  Negative for confusion, hallucinations, sleep disturbance and suicidal ideas.   All other systems reviewed and are negative.       Objective:  BP 139/78   Pulse 90   Temp 98.1 F (36.7 C) (Temporal)   Ht 5\' 7"  (1.702 m)   Wt 298 lb 3.2 oz (135.3 kg)   SpO2 94%   BMI 46.70 kg/m    Wt Readings from Last 3 Encounters:  04/01/23 298 lb 3.2 oz (135.3 kg)  03/07/23 300 lb (136.1 kg)  02/05/23 (!) 301 lb 6.4 oz (136.7 kg)    Physical Exam Vitals and nursing note reviewed.   Constitutional:      Appearance: Normal appearance. She is morbidly obese.  HENT:     Head: Normocephalic and atraumatic.     Mouth/Throat:     Mouth: Mucous membranes are moist.  Eyes:     Pupils: Pupils are equal, round, and reactive to light.  Cardiovascular:     Rate and Rhythm: Normal rate.  Pulmonary:     Effort: Pulmonary effort is normal.  Skin:    General: Skin is warm and dry.     Capillary Refill: Capillary refill takes less than 2 seconds.  Neurological:     General: No focal deficit present.     Mental Status: She is alert and oriented to person, place, and time.  Psychiatric:        Mood and Affect: Mood normal.  Behavior: Behavior normal.        Thought Content: Thought content normal.        Judgment: Judgment normal.     Results for orders placed or performed in visit on 03/07/23  HM MAMMOGRAPHY  Result Value Ref Range   HM Mammogram 0-4 Bi-Rad 0-4 Bi-Rad, Self Reported Normal       Pertinent labs & imaging results that were available during my care of the patient were reviewed by me and considered in my medical decision making.  Assessment & Plan:  Kathy Norman was seen today for medical management of chronic issues.  Diagnoses and all orders for this visit:  Morbid obesity (HCC) Encounter for weight management Has done well with diet and exercise since last visit, down 2.5 pounds. Will start below as insurance will cover. Continue with lifestyle changes. Follow up 8 weeks after starting medications.  -     Semaglutide-Weight Management 0.25 MG/0.5ML OAJ; Inject 0.25 mg into the skin once a week for 28 days. -     Semaglutide-Weight Management 0.5 MG/0.5ML SOAJ; Inject 0.5 mg into the skin once a week for 28 days. -     Semaglutide-Weight Management 1 MG/0.5ML SOAJ; Inject 1 mg into the skin once a week for 28 days.  Screening for colorectal cancer -     Ambulatory referral to Gastroenterology     Continue all other maintenance  medications.  Follow up plan: Return in about 8 weeks (around 05/27/2023) for [facility] follow up.   Continue healthy lifestyle choices, including diet (rich in fruits, vegetables, and lean proteins, and low in salt and simple carbohydrates) and exercise (at least 30 minutes of moderate physical activity daily).  Educational handout given for calorie counting for weight management  The above assessment and management plan was discussed with the patient. The patient verbalized understanding of and has agreed to the management plan. Patient is aware to call the clinic if they develop any new symptoms or if symptoms persist or worsen. Patient is aware when to return to the clinic for a follow-up visit. Patient educated on when it is appropriate to go to the emergency department.   Kari Baars, FNP-C [facility] [phone]

## 2023-04-01 NOTE — Patient Instructions (Signed)

## 2023-04-02 ENCOUNTER — Telehealth: Payer: Self-pay

## 2023-04-02 NOTE — Telephone Encounter (Signed)
*  Primary  PA request received for [facility] 0.25MG /0.5ML auto-injectors  PA submitted to [employer] Part D via CMM and is pending additional questions/determination.  Key: [id number]

## 2023-04-02 NOTE — Telephone Encounter (Signed)
*  Primary  PA request received for Wegovy 0.5MG /0.5ML auto-injectors  PA submitted to [employer] Medicare Part D via CMM and is pending additional questions/determination  Key: [id number]

## 2023-04-03 NOTE — Telephone Encounter (Signed)
PA has been DENIED:   Your requested medication is an anti-obesity medication that is excluded from Part D prescription coverage under Medicare rules, unless it is being used for a medically-accepted diagnosis approved by the Food and Drug Administration. Please refer to your Evidence of Coverage (EOC) section that references Part D drug coverage in your pharmacy plan documents for more information. Reviewed by: Deeann Saint.Ph. *Please note: Reginal Lutes may be covered when used for a medically-accepted indication. The Food and Drug Administration has approved [phone] for the treatment to reduce the risk of major adverse cardiovascular events (cardiovascular death, non-fatal myocardial infarction, or non-fatal stroke). Any request outside this indication is an exclusion

## 2023-04-03 NOTE — Telephone Encounter (Signed)
PA has been DENIED. See other encounter for 0.25mg 

## 2023-04-10 ENCOUNTER — Encounter: Payer: Self-pay | Admitting: *Deleted

## 2023-04-11 ENCOUNTER — Encounter: Payer: Self-pay | Admitting: Family Medicine

## 2023-04-21 DIAGNOSIS — G4733 Obstructive sleep apnea (adult) (pediatric): Secondary | ICD-10-CM | POA: Diagnosis not present

## 2023-05-06 ENCOUNTER — Telehealth: Payer: Self-pay | Admitting: Internal Medicine

## 2023-05-06 NOTE — Telephone Encounter (Signed)
Received questionnaire, LMOM for patient that I will be mailing her a release to sign for me to get her last colonoscopy from 2019 at [facility].

## 2023-05-07 ENCOUNTER — Telehealth: Payer: Self-pay | Admitting: *Deleted

## 2023-05-07 NOTE — Telephone Encounter (Signed)
  Procedure: Colonoscopy  Height: 5'7 Weight: 298lbs        Have you had a colonoscopy before?  2020 [facility], pathology report in careeverywhere  Do you have family history of colon cancer?  no  Do you have a family history of polyps? yes  Previous colonoscopy with polyps removed? yes  Do you have a history colorectal cancer?   no  Are you diabetic?  no  Do you have a prosthetic or mechanical heart valve? no  Do you have a pacemaker/defibrillator?   no  Have you had endocarditis/atrial fibrillation?  no  Do you use supplemental oxygen/CPAP?  yes cpap  Have you had joint replacement within the last 12 months?  no  Do you tend to be constipated or have to use laxatives?  no   Do you have history of alcohol use? If yes, how much and how often.  no  Do you have history or are you using drugs? If yes, what do are you  using?  no  Have you ever had a stroke/heart attack?  no  Have you ever had a heart or other vascular stent placed,?no  Do you take weight loss medication? Yes wegovy  female patients,: have you had a hysterectomy? no                              are you post menopausal?  yes                              do you still have your menstrual cycle? no    Date of last menstrual period? 2016  Do you take any blood-thinning medications such as: (Plavix, aspirin, Coumadin, Aggrenox, Brilinta, Xarelto, Eliquis, Pradaxa, Savaysa or Effient)? Aspirin 81mg   If yes we need the name, milligram, dosage and who is prescribing doctor:               Current Outpatient Medications  Medication Sig Dispense Refill   amitriptyline (ELAVIL) 50 MG tablet Take 50 mg by mouth daily.     aspirin EC 81 MG tablet Take 1 tablet by mouth daily.     Cholecalciferol 125 MCG (5000 UT) TABS Take 1 tablet by mouth daily.     citalopram (CELEXA) 20 MG tablet Take 1 tablet (20 mg total) by mouth daily. (Patient taking differently: Take 20 mg by mouth 2 (two) times daily.) 30 tablet 3    levothyroxine (SYNTHROID) 75 MCG tablet Take 1 tablet by mouth daily.     lisinopril (PRINIVIL,ZESTRIL) 20 MG tablet Take 1 tablet (20 mg total) by mouth daily. 30 tablet 0   Semaglutide-Weight Management 0.5 MG/0.5ML SOAJ Inject 0.5 mg into the skin once a week for 28 days. 2 mL 0   [START ON 05/29/2023] Semaglutide-Weight Management 1 MG/0.5ML SOAJ Inject 1 mg into the skin once a week for 28 days. 2 mL 0   simvastatin (ZOCOR) 20 MG tablet Take 20 mg by mouth at bedtime.     No current facility-administered medications for this visit.    Allergies  Allergen Reactions   Sulfa Antibiotics Hives   Sulfamethoxazole-Trimethoprim Hives   Misc. Sulfonamide Containing Compounds Hives   Sertraline Rash   Sertraline Hcl Rash

## 2023-05-21 ENCOUNTER — Encounter: Payer: Self-pay | Admitting: Family Medicine

## 2023-05-28 ENCOUNTER — Ambulatory Visit: Payer: 59 | Admitting: Family Medicine

## 2023-06-19 ENCOUNTER — Telehealth: Payer: Self-pay | Admitting: Family Medicine

## 2023-06-19 NOTE — Telephone Encounter (Signed)
No, it will not be approved for obesity with back problems/disabled. It's only if they are obese with a cardiac indication as well (for medicare)--history of MI, stroke etc

## 2023-06-19 NOTE — Telephone Encounter (Signed)
Pt wants PCP to resend Kathy Norman Rx to [facility] to get approved. Says if PCP shows that pt is disabled with back problems and has depression, then they may approve it. Please advise.

## 2023-06-19 NOTE — Telephone Encounter (Signed)
Spoke with patient and she states that she talked to [facility] and they said that if she has a health problem wegovy would be covered- disabled with back problems.   Do you know anything about this?

## 2023-06-20 NOTE — Telephone Encounter (Signed)
lmtcb

## 2023-06-20 NOTE — Telephone Encounter (Signed)
Patient aware and verbalizes understanding. 

## 2023-06-25 NOTE — Telephone Encounter (Signed)
1 tubular adenoma removed 2020, due for colonoscopy next year. Thanks

## 2023-06-26 ENCOUNTER — Encounter (INDEPENDENT_AMBULATORY_CARE_PROVIDER_SITE_OTHER): Payer: Self-pay | Admitting: *Deleted

## 2023-06-26 ENCOUNTER — Encounter: Payer: Self-pay | Admitting: *Deleted

## 2023-06-26 NOTE — Telephone Encounter (Signed)
Placed on recall for tcs in one year

## 2023-06-26 NOTE — Telephone Encounter (Signed)
Patient on recall for 06/2024 for 1 year TCS and letter sent to pcp advising patient not due

## 2023-06-26 NOTE — Telephone Encounter (Signed)
Pt is aware she is not due yet and letter sent to PCP

## 2023-06-26 NOTE — Telephone Encounter (Signed)
Referral has been closed and PCP aware patient not due

## 2023-07-10 DIAGNOSIS — G4733 Obstructive sleep apnea (adult) (pediatric): Secondary | ICD-10-CM | POA: Diagnosis not present

## 2023-08-01 ENCOUNTER — Encounter: Payer: Self-pay | Admitting: Family Medicine

## 2023-08-01 ENCOUNTER — Telehealth (INDEPENDENT_AMBULATORY_CARE_PROVIDER_SITE_OTHER): Payer: 59 | Admitting: Family Medicine

## 2023-08-01 DIAGNOSIS — M25471 Effusion, right ankle: Secondary | ICD-10-CM

## 2023-08-01 DIAGNOSIS — M25475 Effusion, left foot: Secondary | ICD-10-CM | POA: Diagnosis not present

## 2023-08-01 DIAGNOSIS — M25474 Effusion, right foot: Secondary | ICD-10-CM | POA: Diagnosis not present

## 2023-08-01 DIAGNOSIS — M25472 Effusion, left ankle: Secondary | ICD-10-CM

## 2023-08-01 MED ORDER — FUROSEMIDE 20 MG PO TABS
20.0000 mg | ORAL_TABLET | Freq: Every day | ORAL | 0 refills | Status: DC
Start: 2023-08-01 — End: 2023-08-29

## 2023-08-01 NOTE — Progress Notes (Signed)
Virtual Visit via Video   I connected with patient on 08/01/23 at 1320 by a video enabled telemedicine application and verified that I am speaking with the correct person using two identifiers.  Location patient: Home Location provider: [facility] Persons participating in the virtual visit: Patient and Provider  I discussed the limitations of evaluation and management by telemedicine and the availability of in person appointments. The patient expressed understanding and agreed to proceed.  Subjective:   HPI:  Pt presents today for  Chief Complaint  Patient presents with   Foot Swelling   Pt reports feet and ankle swelling over the last several weeks. She states this progresses as the day goes on, does improve when propping her legs up. She states she probably does have a high sodium intake but can not report amount/day. No significant weight changes. She denies chest pain, shortness of breath, orthopnea, PND, palpitations, weakness, syncope, or dizziness. Has not had this issue in the past.      Review of Systems  Constitutional:  Negative for chills, diaphoresis, fever, malaise/fatigue and weight loss.  Respiratory:  Negative for cough, hemoptysis, sputum production, shortness of breath and wheezing.   Cardiovascular:  Positive for leg swelling. Negative for chest pain, palpitations, orthopnea, claudication and PND.  Gastrointestinal:  Negative for abdominal pain.  Genitourinary:  Negative for dysuria, flank pain, frequency, hematuria and urgency.  Skin:  Negative for itching and rash.  Neurological:  Negative for dizziness, tingling, tremors, sensory change, speech change, focal weakness, seizures, loss of consciousness, weakness and headaches.  All other systems reviewed and are negative.    Patient Active Problem List   Diagnosis Date Noted   Morbid obesity (HCC) 02/05/2023   Mixed hyperlipidemia 02/05/2023   Vitamin D deficiency  02/05/2023   OSA (obstructive sleep apnea) 04/06/2018   Major depressive disorder, single episode, unspecified 10/15/2017   Depression 09/27/2016   Insomnia 07/02/2010   Primary hypertension 11/16/2007   Anemia, iron deficiency 07/20/2007    Social History   Tobacco Use   Smoking status: Never   Smokeless tobacco: Never  Substance Use Topics   Alcohol use: No    Current Outpatient Medications:    furosemide (LASIX) 20 MG tablet, Take 1 tablet (20 mg total) by mouth daily., Disp: 30 tablet, Rfl: 0   amitriptyline (ELAVIL) 50 MG tablet, Take 50 mg by mouth daily., Disp: , Rfl:    aspirin EC 81 MG tablet, Take 1 tablet by mouth daily., Disp: , Rfl:    Cholecalciferol 125 MCG (5000 UT) TABS, Take 1 tablet by mouth daily., Disp: , Rfl:    citalopram (CELEXA) 20 MG tablet, Take 1 tablet (20 mg total) by mouth daily. (Patient taking differently: Take 20 mg by mouth 2 (two) times daily.), Disp: 30 tablet, Rfl: 3   levothyroxine (SYNTHROID) 75 MCG tablet, Take 1 tablet by mouth daily., Disp: , Rfl:    lisinopril (PRINIVIL,ZESTRIL) 20 MG tablet, Take 1 tablet (20 mg total) by mouth daily., Disp: 30 tablet, Rfl: 0   simvastatin (ZOCOR) 20 MG tablet, Take 20 mg by mouth at bedtime., Disp: , Rfl:   Allergies  Allergen Reactions   Sulfa Antibiotics Hives   Sulfamethoxazole-Trimethoprim Hives   Misc. Sulfonamide Containing Compounds Hives   Sertraline Rash   Sertraline Hcl Rash    Objective:   There were no vitals taken for this visit.  Patient is well-developed, well-nourished in no acute distress.  Resting comfortably at home.  Head  is normocephalic, atraumatic.  No labored breathing.  Speech is clear and coherent with logical content.  Patient is alert and oriented at baseline.    Assessment and Plan:   Xayla was seen today for foot swelling.  Diagnoses and all orders for this visit:  Bilateral swelling of feet and ankles Decrease sodium intake, wear compression socks,  elevate legs when able. Lasix for 5 days then as needed for swelling. Follow up in 2 weeks for reevaluation. Sooner if symptoms worsen or remain persistent. Previous labs reviewed and renal function and potassium were normal.  -     furosemide (LASIX) 20 MG tablet; Take 1 tablet (20 mg total) by mouth daily.      Return if symptoms worsen or fail to improve.  Kari Baars, FNP-C [facility] [address] [place], [place] [address] [phone]  08/01/2023  Time spent with the patient: 15 minutes, of which >50% was spent in obtaining information about symptoms, reviewing previous labs, evaluations, and treatments, counseling about condition (please see the discussed topics above), and developing a plan to further investigate it; had a number of questions which I addressed.

## 2023-08-15 ENCOUNTER — Ambulatory Visit: Payer: 59 | Admitting: Family Medicine

## 2023-08-20 ENCOUNTER — Ambulatory Visit: Payer: 59 | Admitting: Family Medicine

## 2023-08-20 ENCOUNTER — Ambulatory Visit (INDEPENDENT_AMBULATORY_CARE_PROVIDER_SITE_OTHER): Payer: 59

## 2023-08-20 ENCOUNTER — Encounter: Payer: Self-pay | Admitting: Family Medicine

## 2023-08-20 VITALS — BP 130/79 | HR 103 | Temp 98.2°F | Ht 67.0 in | Wt 299.8 lb

## 2023-08-20 DIAGNOSIS — E782 Mixed hyperlipidemia: Secondary | ICD-10-CM

## 2023-08-20 DIAGNOSIS — I10 Essential (primary) hypertension: Secondary | ICD-10-CM | POA: Diagnosis not present

## 2023-08-20 DIAGNOSIS — R7989 Other specified abnormal findings of blood chemistry: Secondary | ICD-10-CM | POA: Diagnosis not present

## 2023-08-20 DIAGNOSIS — M25475 Effusion, left foot: Secondary | ICD-10-CM

## 2023-08-20 DIAGNOSIS — M25474 Effusion, right foot: Secondary | ICD-10-CM | POA: Diagnosis not present

## 2023-08-20 DIAGNOSIS — R0600 Dyspnea, unspecified: Secondary | ICD-10-CM | POA: Diagnosis not present

## 2023-08-20 DIAGNOSIS — R0789 Other chest pain: Secondary | ICD-10-CM

## 2023-08-20 DIAGNOSIS — M25472 Effusion, left ankle: Secondary | ICD-10-CM

## 2023-08-20 DIAGNOSIS — R0609 Other forms of dyspnea: Secondary | ICD-10-CM

## 2023-08-20 DIAGNOSIS — M7989 Other specified soft tissue disorders: Secondary | ICD-10-CM | POA: Diagnosis not present

## 2023-08-20 DIAGNOSIS — M25471 Effusion, right ankle: Secondary | ICD-10-CM

## 2023-08-20 NOTE — Progress Notes (Signed)
Subjective:  Patient ID: Kathy Norman, female    DOB: March 29, 1966, 57 y.o.   MRN: [id number]  Patient Care Team: Sonny Masters, FNP as PCP - General (Family Medicine)   Chief Complaint:  Edema (Bilateral feet & ankles - 2 week follow up )   HPI: Kathy Norman is a 57 y.o. female presenting on 08/20/2023 for Edema (Bilateral feet & ankles - 2 week follow up )   Discussed the use of AI scribe software for clinical note transcription with the patient, who gave verbal consent to proceed.  History of Present Illness   The patient presents today for lower extremity swelling follow up. She has been taking Lasix daily since the last visit a couple of weeks ago. They report an increase in urine production and a reduction in salt intake, which has seemingly improved their condition. However, they experienced two episodes of sharp pain that starts in the chest and radiates around, causing breathlessness. The frequency of this pain varies, occurring as often as three times a day to as infrequently as three times a month, with an average of every other week. Following these episodes, the patient reports feeling exhausted.  They also report shortness of breath when walking up the stairs and needing to prop themselves up on two pillows at night. They deny waking up at night feeling like they can't breathe or drowning. The patient also reports some puffiness in their feet, although it has improved. They have not seen a cardiologist to their recollection.          Relevant past medical, surgical, family, and social history reviewed and updated as indicated.  Allergies and medications reviewed and updated. Data reviewed: Chart in Epic.   Past Medical History:  Diagnosis Date   Depression    Hypertension     Past Surgical History:  Procedure Laterality Date   CESAREAN SECTION     GALLBLADDER SURGERY      Social History   Socioeconomic History   Marital status: Single    Spouse name:  Not on file   Number of children: 1   Years of education: Not on file   Highest education level: 12th grade  Occupational History   Not on file  Tobacco Use   Smoking status: Never   Smokeless tobacco: Never  Vaping Use   Vaping status: Never Used  Substance and Sexual Activity   Alcohol use: No   Drug use: No   Sexual activity: Yes  Other Topics Concern   Not on file  Social History Narrative   Not on file   Social Determinants of Health   Financial Resource Strain: Low Risk  (08/16/2023)   Overall Financial Resource Strain (CARDIA)    Difficulty of Paying Living Expenses: Not hard at all  Food Insecurity: No Food Insecurity (08/16/2023)   Hunger Vital Sign    Worried About Running Out of Food in the Last Year: Never true    Ran Out of Food in the Last Year: Never true  Transportation Needs: No Transportation Needs (08/16/2023)   PRAPARE - Administrator, Civil Service (Medical): No    Lack of Transportation (Non-Medical): No  Physical Activity: Inactive (08/16/2023)   Exercise Vital Sign    Days of Exercise per Week: 0 days    Minutes of Exercise per Session: 30 min  Stress: Stress Concern Present (08/16/2023)   [employer] of Occupational Health - Occupational Stress Questionnaire    Feeling of  Stress : To some extent  Social Connections: Socially Isolated (08/16/2023)   Social Connection and Isolation Panel [NHANES]    Frequency of Communication with Friends and Family: More than three times a week    Frequency of Social Gatherings with Friends and Family: Twice a week    Attends Religious Services: Never    Database administrator or Organizations: No    Attends Banker Meetings: Never    Marital Status: Never married  Intimate Partner Violence: Not At Risk (03/07/2023)   Humiliation, Afraid, Rape, and Kick questionnaire    Fear of Current or Ex-Partner: No    Emotionally Abused: No    Physically Abused: No    Sexually Abused: No     Outpatient Encounter Medications as of 08/20/2023  Medication Sig   amitriptyline (ELAVIL) 50 MG tablet Take 50 mg by mouth daily.   aspirin EC 81 MG tablet Take 1 tablet by mouth daily.   Cholecalciferol 125 MCG (5000 UT) TABS Take 1 tablet by mouth daily.   citalopram (CELEXA) 20 MG tablet Take 1 tablet (20 mg total) by mouth daily. (Patient taking differently: Take 20 mg by mouth 2 (two) times daily.)   furosemide (LASIX) 20 MG tablet Take 1 tablet (20 mg total) by mouth daily.   levothyroxine (SYNTHROID) 75 MCG tablet Take 1 tablet by mouth daily.   lisinopril (PRINIVIL,ZESTRIL) 20 MG tablet Take 1 tablet (20 mg total) by mouth daily.   simvastatin (ZOCOR) 20 MG tablet Take 20 mg by mouth at bedtime.   No facility-administered encounter medications on file as of 08/20/2023.    Allergies  Allergen Reactions   Sulfa Antibiotics Hives   Sulfamethoxazole-Trimethoprim Hives   Misc. Sulfonamide Containing Compounds Hives   Sertraline Rash   Sertraline Hcl Rash    Pertinent ROS per HPI, otherwise unremarkable      Objective:  BP 130/79   Pulse (!) 103   Temp 98.2 F (36.8 C)   Ht 5\' 7"  (1.702 m)   Wt 299 lb 12.8 oz (136 kg)   SpO2 95%   BMI 46.96 kg/m    Wt Readings from Last 3 Encounters:  08/20/23 299 lb 12.8 oz (136 kg)  04/01/23 298 lb 3.2 oz (135.3 kg)  03/07/23 300 lb (136.1 kg)    Physical Exam Vitals and nursing note reviewed.  Constitutional:      Appearance: Normal appearance. She is morbidly obese.  HENT:     Head: Normocephalic and atraumatic.     Nose: Nose normal.     Mouth/Throat:     Mouth: Mucous membranes are moist.     Pharynx: Oropharynx is clear.  Eyes:     Conjunctiva/sclera: Conjunctivae normal.     Pupils: Pupils are equal, round, and reactive to light.  Neck:     Vascular: No JVD.  Cardiovascular:     Rate and Rhythm: Regular rhythm. Tachycardia present.     Heart sounds: Normal heart sounds. No murmur heard.    No friction rub.  No gallop. No S3 sounds.  Pulmonary:     Effort: Pulmonary effort is normal. No respiratory distress.     Breath sounds: Normal breath sounds.  Abdominal:     General: Bowel sounds are normal. There is no distension.     Palpations: Abdomen is soft.  Musculoskeletal:     Cervical back: Normal range of motion and neck supple.     Right lower leg: Edema (trace) present.  Left lower leg: Edema (trace) present.  Skin:    General: Skin is warm and dry.     Capillary Refill: Capillary refill takes less than 2 seconds.  Neurological:     General: No focal deficit present.     Mental Status: She is alert and oriented to person, place, and time.  Psychiatric:        Mood and Affect: Mood normal.        Behavior: Behavior normal. Behavior is cooperative.        Thought Content: Thought content normal.        Judgment: Judgment normal.    Physical Exam   EXTREMITIES: Mild edema in feet.        Results for orders placed or performed in visit on 04/11/23  HM MAMMOGRAPHY  Result Value Ref Range   HM Mammogram 0-4 Bi-Rad 0-4 Bi-Rad, Self Reported Normal     EKG: ST, 102, PR 144 ms, QT 358 ms, incomplete BBB, no acute ST-T changes, no significant changes from prior EKG. Kari Baars, FNP-C  X-Ray: CXR: no changes from prior imaging. No acute findings. Preliminary x-ray reading by Kari Baars, FNP-C, [facility].   Pertinent labs & imaging results that were available during my care of the patient were reviewed by me and considered in my medical decision making.  Assessment & Plan:  Kerstyn was seen today for edema.  Diagnoses and all orders for this visit:  Bilateral swelling of feet and ankles -     CMP14+EGFR -     CBC with Differential/Platelet -     Brain natriuretic peptide -     EKG 12-Lead -     ECHOCARDIOGRAM COMPLETE; Future -     DG Chest 2 View; Future -     Ambulatory referral to Cardiology  Dyspnea on exertion -     CMP14+EGFR -     CBC with  Differential/Platelet -     Lipid panel -     Thyroid Panel With TSH -     Brain natriuretic peptide -     EKG 12-Lead -     ECHOCARDIOGRAM COMPLETE; Future -     DG Chest 2 View; Future -     Ambulatory referral to Cardiology  Chest discomfort -     CMP14+EGFR -     CBC with Differential/Platelet -     Lipid panel -     Thyroid Panel With TSH -     Brain natriuretic peptide -     EKG 12-Lead -     ECHOCARDIOGRAM COMPLETE; Future -     DG Chest 2 View; Future -     Ambulatory referral to Cardiology  Primary hypertension -     CMP14+EGFR -     CBC with Differential/Platelet -     Lipid panel -     Thyroid Panel With TSH -     Ambulatory referral to Cardiology  Mixed hyperlipidemia -     CMP14+EGFR -     Lipid panel -     Ambulatory referral to Cardiology  Morbid obesity (HCC) -     CMP14+EGFR -     CBC with Differential/Platelet -     Lipid panel -     Thyroid Panel With TSH -     Ambulatory referral to Cardiology     Assessment and Plan    Lower extremity edema Improved with Lasix 20mg  daily and reduced salt intake. No acute distress. Mild residual edema  noted on exam. -Continue Lasix 20mg  daily. -Check BMP to assess renal function. -Order echocardiogram to assess for structural heart abnormalities.  Intermittent chest pain Sharp, radiating pain causing shortness of breath. Frequency varies from multiple times a day to a few times a month. No prior workup. -Order EKG and chest x-ray to assess for heart dysfunction or fluid buildup in lungs. -Refer to cardiology for further evaluation.  Dyspnea on exertion Shortness of breath with stair climbing. No nocturnal dyspnea. -Continue monitoring symptoms and follow up with cardiology.  Weight management Patient considering OBVI weight management supplement. Contains caffeine which may increase blood pressure. -Advised patient of potential side effects of supplement, including increased blood pressure and  palpitations.  Follow-up in 8 weeks or sooner if symptoms worsen. Continue Lasix and monitor symptoms. Await results of labs, chest x-ray, and echocardiogram.          Continue all other maintenance medications.  Follow up plan: Return in about 8 weeks (around 10/15/2023), or if symptoms worsen or fail to improve, for edema, dyspnea.   Continue healthy lifestyle choices, including diet (rich in fruits, vegetables, and lean proteins, and low in salt and simple carbohydrates) and exercise (at least 30 minutes of moderate physical activity daily).  Educational handout given for edema  The above assessment and management plan was discussed with the patient. The patient verbalized understanding of and has agreed to the management plan. Patient is aware to call the clinic if they develop any new symptoms or if symptoms persist or worsen. Patient is aware when to return to the clinic for a follow-up visit. Patient educated on when it is appropriate to go to the emergency department.   Kari Baars, FNP-C [facility] [phone]

## 2023-08-21 LAB — CBC WITH DIFFERENTIAL/PLATELET
Basophils Absolute: 0 10*3/uL (ref 0.0–0.2)
Basos: 1 %
EOS (ABSOLUTE): 0.1 10*3/uL (ref 0.0–0.4)
Eos: 2 %
Hematocrit: 43.8 % (ref 34.0–46.6)
Hemoglobin: 14.2 g/dL (ref 11.1–15.9)
Immature Grans (Abs): 0 10*3/uL (ref 0.0–0.1)
Immature Granulocytes: 0 %
Lymphocytes Absolute: 2.3 10*3/uL (ref 0.7–3.1)
Lymphs: 32 %
MCH: 31.2 pg (ref 26.6–33.0)
MCHC: 32.4 g/dL (ref 31.5–35.7)
MCV: 96 fL (ref 79–97)
Monocytes Absolute: 0.5 10*3/uL (ref 0.1–0.9)
Monocytes: 7 %
Neutrophils Absolute: 4.1 10*3/uL (ref 1.4–7.0)
Neutrophils: 58 %
Platelets: 289 10*3/uL (ref 150–450)
RBC: 4.55 x10E6/uL (ref 3.77–5.28)
RDW: 11.7 % (ref 11.7–15.4)
WBC: 7.1 10*3/uL (ref 3.4–10.8)

## 2023-08-21 LAB — LIPID PANEL
Chol/HDL Ratio: 3.2 ratio (ref 0.0–4.4)
Cholesterol, Total: 143 mg/dL (ref 100–199)
HDL: 45 mg/dL (ref >39)
LDL Chol Calc (NIH): 80 mg/dL (ref 0–99)
Triglycerides: 99 mg/dL (ref 0–149)
VLDL Cholesterol Cal: 18 mg/dL (ref 5–40)

## 2023-08-21 LAB — THYROID PANEL WITH TSH
Free Thyroxine Index: 3.3 (ref 1.2–4.9)
T3 Uptake Ratio: 25 % (ref 24–39)
T4, Total: 13.3 ug/dL — ABNORMAL HIGH (ref 4.5–12.0)
TSH: 1.54 u[IU]/mL (ref 0.450–4.500)

## 2023-08-21 LAB — CMP14+EGFR
ALT: 45 [IU]/L — ABNORMAL HIGH (ref 0–32)
AST: 50 [IU]/L — ABNORMAL HIGH (ref 0–40)
Albumin: 4.3 g/dL (ref 3.8–4.9)
Alkaline Phosphatase: 87 [IU]/L (ref 44–121)
BUN/Creatinine Ratio: 9 (ref 9–23)
BUN: 8 mg/dL (ref 6–24)
Bilirubin Total: 0.5 mg/dL (ref 0.0–1.2)
CO2: 21 mmol/L (ref 20–29)
Calcium: 9.8 mg/dL (ref 8.7–10.2)
Chloride: 100 mmol/L (ref 96–106)
Creatinine, Ser: 0.94 mg/dL (ref 0.57–1.00)
Globulin, Total: 3.3 g/dL (ref 1.5–4.5)
Glucose: 113 mg/dL — ABNORMAL HIGH (ref 70–99)
Potassium: 4.5 mmol/L (ref 3.5–5.2)
Sodium: 139 mmol/L (ref 134–144)
Total Protein: 7.6 g/dL (ref 6.0–8.5)
eGFR: 71 mL/min/{1.73_m2} (ref >59)

## 2023-08-21 LAB — BRAIN NATRIURETIC PEPTIDE: BNP: 7.2 pg/mL (ref 0.0–100.0)

## 2023-08-21 NOTE — Addendum Note (Signed)
Addended by: Sonny Masters on: 08/21/2023 07:02 PM   Modules accepted: Orders

## 2023-08-25 ENCOUNTER — Telehealth: Payer: Self-pay | Admitting: Family Medicine

## 2023-08-25 NOTE — Telephone Encounter (Signed)
Patient return call for lab results.  Copied from CRM [id number]. Topic: General - Call Back - No Documentation >> Aug 25, 2023 10:40 AM Dimitri Ped wrote: Reason for CRM: patient calling back saying she received a call and was returning call. Doesn't know what this is about . I dont see any messages or any notes. Could you please give patient a call please . Call back number [phone]

## 2023-08-25 NOTE — Telephone Encounter (Signed)
SPOKE WITH PATIENT REGARDING LAB RESULTS

## 2023-08-27 ENCOUNTER — Ambulatory Visit (HOSPITAL_COMMUNITY): Payer: 59

## 2023-08-29 ENCOUNTER — Other Ambulatory Visit: Payer: Self-pay | Admitting: Family Medicine

## 2023-08-29 DIAGNOSIS — M25471 Effusion, right ankle: Secondary | ICD-10-CM

## 2023-09-01 ENCOUNTER — Ambulatory Visit (HOSPITAL_COMMUNITY): Payer: 59

## 2023-09-09 ENCOUNTER — Ambulatory Visit (HOSPITAL_COMMUNITY)
Admission: RE | Admit: 2023-09-09 | Discharge: 2023-09-09 | Disposition: A | Discharge status: Home / Self Care | Payer: 59 | Source: Ambulatory Visit | Attending: Family Medicine | Admitting: Family Medicine

## 2023-09-09 DIAGNOSIS — R7989 Other specified abnormal findings of blood chemistry: Secondary | ICD-10-CM | POA: Diagnosis not present

## 2023-09-09 DIAGNOSIS — Z9049 Acquired absence of other specified parts of digestive tract: Secondary | ICD-10-CM | POA: Diagnosis not present

## 2023-09-15 ENCOUNTER — Other Ambulatory Visit (HOSPITAL_COMMUNITY): Payer: 59

## 2023-09-16 ENCOUNTER — Ambulatory Visit (HOSPITAL_COMMUNITY)
Admission: RE | Admit: 2023-09-16 | Discharge: 2023-09-16 | Disposition: A | Discharge status: Home / Self Care | Payer: 59 | Source: Ambulatory Visit | Attending: Family Medicine | Admitting: Family Medicine

## 2023-09-16 DIAGNOSIS — R0609 Other forms of dyspnea: Secondary | ICD-10-CM | POA: Insufficient documentation

## 2023-09-16 DIAGNOSIS — M25475 Effusion, left foot: Secondary | ICD-10-CM | POA: Diagnosis not present

## 2023-09-16 DIAGNOSIS — M25471 Effusion, right ankle: Secondary | ICD-10-CM | POA: Diagnosis not present

## 2023-09-16 DIAGNOSIS — M25472 Effusion, left ankle: Secondary | ICD-10-CM | POA: Insufficient documentation

## 2023-09-16 DIAGNOSIS — R0789 Other chest pain: Secondary | ICD-10-CM | POA: Diagnosis not present

## 2023-09-16 DIAGNOSIS — M25474 Effusion, right foot: Secondary | ICD-10-CM | POA: Diagnosis not present

## 2023-09-16 LAB — ECHOCARDIOGRAM COMPLETE
AR max vel: 2.58 cm2
AV Area VTI: 2.69 cm2
AV Area mean vel: 2.89 cm2
AV Mean grad: 3.4 mm[Hg]
AV Peak grad: 6.1 mm[Hg]
Ao pk vel: 1.23 m/s
Area-P 1/2: 3.02 cm2
Calc EF: 50.2 %
MV VTI: 3.7 cm2
S' Lateral: 2.9 cm
Single Plane A2C EF: 40.9 %
Single Plane A4C EF: 49.8 %

## 2023-09-16 MED ORDER — PERFLUTREN LIPID MICROSPHERE
1.0000 mL | INTRAVENOUS | Status: AC | PRN
Start: 2023-09-16 — End: 2023-09-16
  Administered 2023-09-16: 2 mL via INTRAVENOUS

## 2023-09-16 NOTE — Progress Notes (Signed)
  Echocardiogram 2D Echocardiogram has been performed.  Kathy Norman 09/16/2023, 9:15 AM

## 2023-09-19 NOTE — Telephone Encounter (Signed)
Copied from CRM [id number]. Topic: Clinical - Lab/Test Results >> Sep 19, 2023 12:32 PM Tiffany H wrote: Reason for CRM: Patient called with some follow up questions regarding her recent echocardiogram. She would like to receive a phone call to discuss treatment options. Please assist.

## 2023-09-23 ENCOUNTER — Encounter: Payer: Self-pay | Admitting: *Deleted

## 2023-09-23 NOTE — Patient Instructions (Signed)

## 2023-09-23 NOTE — Progress Notes (Unsigned)
PATIENT: Kathy Norman DOB: 09/02/1966  REASON FOR VISIT: follow up HISTORY FROM: patient  Virtual Visit via Telephone Note  I connected with [name] on 09/24/23 at  9:30 AM EST by telephone and verified that I am speaking with the correct person using two identifiers.   I discussed the limitations, risks, security and privacy concerns of performing an evaluation and management service by telephone and the availability of in person appointments. I also discussed with the patient that there may be a patient responsible charge related to this service. The patient expressed understanding and agreed to proceed.   History of Present Illness:  09/24/23 ALL: Kathy Norman is a 57 y.o. female here today for follow up for OSa on CPAP. She continues to do well on therapy. She is using CPAP nightly for about 8 hours, on average. She loves using CPAP. She denies concerns with machine or supplies.      History (copied from Dr Teofilo Pod previous note)  I saw Kathy Norman as a referral from Dr. Ronal Fear office for evaluation of her sleep apnea, request for transfer of care.  The patient is unaccompanied today.  [name] is a 57 year old female with an underlying medical history of hypertension, depression, hypothyroidism, back pain, reflux disease, and obesity, who reports doing well on her autoPAP. She is not sure about the pressure setting. A DL was not available, we could not get the SD card to read her data, she has a ResMed air sense 11 AutoSet machine.  She reports a prior diagnosis of obstructive sleep apnea.  She has been on PAP therapy for the past 2 years.  She reports full compliance and great benefit, she reports that her daytime sleepiness improved, her sleep quality improved and her nocturia improved.  She is in the process of transitioning to a new primary care as well.  Her bedtime is generally around 9 PM, rise time is around 7:30 AM or 8 AM.  She is single and lives alone,  no pets in the household, she has 1 grown son.  She is on disability for her low back pain and her depression.  She is followed by mental health.  She is a non-smoker and does not drink any alcohol, she drinks quite a bit of caffeine in the form of soda, about 4 16.9 ounce bottles per day.  She is working on weight loss.  She is not followed by weight management clinic.  She has no night to night nocturia and no recurrent nocturnal or morning headaches.  She uses a nasal mask, DME company is [employer].  Her Epworth sleepiness score is 6 out of 24, fatigue severity score is 9 out of 63. She had a baseline sleep study from [facility] sleep [facility] on 06/08/2020 and I was able to review the results: She had mild sleep apnea, AHI was 10.7/h.  Her sleep latency was delayed at 108.9 minutes.  Sleep efficiency was reduced at 70.9%.  REM sleep was absent.  Average oxygen saturation was 93%, nadir was 87%.    Addendum, 09/25/2022: I reviewed patients compliance data for the recent 90 days from 06/20/2022 through 09/17/2022, she was fully compliant with treatment with an average usage of 7-1/2 hours, leak acceptable, 95th percentile of pressure at 11.4 cm, AHI at goal with an average of 0.6/h.   Observations/Objective:  Generalized: Well developed, in no acute distress  Mentation: Alert oriented to time, place, history taking. Follows all commands speech and language fluent  Assessment and Plan:  57 y.o. year old female  has a past medical history of Depression and Hypertension. here with    ICD-10-CM   1. OSA on CPAP  G47.33 For home use only DME continuous positive airway pressure (CPAP)      [name] reports doing well on CPAP therapy. Compliance report shows excellent compliance. She was encouraged to continue nightly use for at least 4 hours. Supply orders renewed. Healthy lifestyle habits encouraged. She will follow up with me in 1 year, sooner if needed.    Orders Placed This Encounter  Procedures    For home use only DME continuous positive airway pressure (CPAP)    Heated Humidity with all supplies as needed    Order Specific Question:   Length of Need    Answer:   Lifetime    Order Specific Question:   Patient has OSA or probable OSA    Answer:   Yes    Order Specific Question:   Is the patient currently using CPAP in the home    Answer:   Yes    Order Specific Question:   Settings    Answer:   Other see comments    Order Specific Question:   CPAP supplies needed    Answer:   Mask, headgear, cushions, filters, heated tubing and water chamber    No orders of the defined types were placed in this encounter.    Follow Up Instructions:  I discussed the assessment and treatment plan with the patient. The patient was provided an opportunity to ask questions and all were answered. The patient agreed with the plan and demonstrated an understanding of the instructions.   The patient was advised to call back or seek an in-person evaluation if the symptoms worsen or if the condition fails to improve as anticipated.  I provided 15 minutes of non-face-to-face time during this encounter. Patient located at their place of residence during Mychart visit. Provider is in the office.    Shawnie Dapper, NP

## 2023-09-24 ENCOUNTER — Telehealth: Payer: 59 | Admitting: Family Medicine

## 2023-09-24 ENCOUNTER — Encounter: Payer: Self-pay | Admitting: Family Medicine

## 2023-09-24 DIAGNOSIS — G4733 Obstructive sleep apnea (adult) (pediatric): Secondary | ICD-10-CM | POA: Diagnosis not present

## 2023-09-24 NOTE — Telephone Encounter (Unsigned)
Copied from CRM [id number]. Topic: Clinical - Lab/Test Results >> Sep 24, 2023 12:31 PM Fuller Mandril wrote: Reason for CRM: Pt returning call to [facility]. Missed to called regarding results of echocardiogram. Reached out to CAL - currently at lunch. Thank You

## 2023-09-29 DIAGNOSIS — G4733 Obstructive sleep apnea (adult) (pediatric): Secondary | ICD-10-CM | POA: Diagnosis not present

## 2023-10-16 ENCOUNTER — Ambulatory Visit: Payer: 59 | Admitting: Family Medicine

## 2023-10-16 ENCOUNTER — Encounter: Payer: Self-pay | Admitting: Family Medicine

## 2023-10-16 VITALS — BP 140/87 | HR 100 | Temp 97.0°F | Ht 67.0 in | Wt 300.4 lb

## 2023-10-16 DIAGNOSIS — M25471 Effusion, right ankle: Secondary | ICD-10-CM

## 2023-10-16 DIAGNOSIS — M25475 Effusion, left foot: Secondary | ICD-10-CM | POA: Diagnosis not present

## 2023-10-16 DIAGNOSIS — M25474 Effusion, right foot: Secondary | ICD-10-CM | POA: Diagnosis not present

## 2023-10-16 DIAGNOSIS — M25561 Pain in right knee: Secondary | ICD-10-CM | POA: Diagnosis not present

## 2023-10-16 DIAGNOSIS — R1084 Generalized abdominal pain: Secondary | ICD-10-CM

## 2023-10-16 DIAGNOSIS — M25472 Effusion, left ankle: Secondary | ICD-10-CM

## 2023-10-16 DIAGNOSIS — K439 Ventral hernia without obstruction or gangrene: Secondary | ICD-10-CM

## 2023-10-16 DIAGNOSIS — M25562 Pain in left knee: Secondary | ICD-10-CM

## 2023-10-16 DIAGNOSIS — G8929 Other chronic pain: Secondary | ICD-10-CM | POA: Diagnosis not present

## 2023-10-16 DIAGNOSIS — R112 Nausea with vomiting, unspecified: Secondary | ICD-10-CM

## 2023-10-16 DIAGNOSIS — R7989 Other specified abnormal findings of blood chemistry: Secondary | ICD-10-CM

## 2023-10-16 DIAGNOSIS — R0609 Other forms of dyspnea: Secondary | ICD-10-CM

## 2023-10-16 MED ORDER — PREDNISONE 20 MG PO TABS: 40.0000 mg | ORAL_TABLET | Freq: Every day | ORAL | 0 refills | Status: AC

## 2023-10-16 MED ORDER — ONDANSETRON HCL 4 MG PO TABS: 4.0000 mg | ORAL_TABLET | Freq: Three times a day (TID) | ORAL | 0 refills | Status: DC | PRN

## 2023-10-16 NOTE — Progress Notes (Signed)
 Subjective:  Patient ID: Kathy Norman, female    DOB: 1966/07/19, 58 y.o.   MRN: [id number]  Patient Care Team: Severa Rock HERO, FNP as PCP - General (Family Medicine)   Chief Complaint:  Abdominal Pain (X 1 1/2 months - states she has been having on and off vomiting after eating. ), Knee Pain (Bilateral knee on and off  1 year ), Shortness of Breath, and Leg Swelling   HPI: Kathy Norman is a 58 y.o. female presenting on 10/16/2023 for Abdominal Pain (X 1 1/2 months - states she has been having on and off vomiting after eating. ), Knee Pain (Bilateral knee on and off  1 year ), Shortness of Breath, and Leg Swelling   Discussed the use of AI scribe software for clinical note transcription with the patient, who gave verbal consent to proceed.  History of Present Illness   The patient, with a history of diastolic dysfunction, fatty liver, and elevated cholesterol, presents with persistent shortness of breath on exertion and leg swelling. Despite taking Lasix , the patient reports only slight improvement in the swelling and no change in the dyspnea. She is scheduled to see cardiology later this month. Echo, CXR, and EKG have been completed.   The patient also reports chronic knee pain, particularly in the right knee, which has been ongoing for over a year. The pain is severe upon standing after sitting for any length of time, but improves with movement. The patient has tried various over-the-counter gels, braces, and hot/cold packs without significant relief. No prior injuries to the knees are reported. The patient has not previously seen an orthopedic specialist or undergone physical therapy for this issue.  Additionally, the patient has been experiencing episodes of nausea and vomiting, seemingly unrelated to specific foods. During these episodes, the patient notices a protrusion in the abdominal area, which has been present for an unspecified duration. The protrusion becomes hard and sore  during these episodes, which can last up to four days. The patient has not had any previous abdominal surgeries.       Relevant past medical, surgical, family, and social history reviewed and updated as indicated.  Allergies and medications reviewed and updated. Data reviewed: Chart in Epic.   Past Medical History:  Diagnosis Date   Depression    Hypertension     Past Surgical History:  Procedure Laterality Date   CESAREAN SECTION     GALLBLADDER SURGERY      Social History   Socioeconomic History   Marital status: Single    Spouse name: Not on file   Number of children: 1   Years of education: Not on file   Highest education level: 12th grade  Occupational History   Not on file  Tobacco Use   Smoking status: Never   Smokeless tobacco: Never  Vaping Use   Vaping status: Never Used  Substance and Sexual Activity   Alcohol use: No   Drug use: No   Sexual activity: Yes  Other Topics Concern   Not on file  Social History Narrative   Not on file   Social Drivers of Health   Financial Resource Strain: Low Risk  (10/12/2023)   Overall Financial Resource Strain (CARDIA)    Difficulty of Paying Living Expenses: Not hard at all  Food Insecurity: No Food Insecurity (10/12/2023)   Hunger Vital Sign    Worried About Running Out of Food in the Last Year: Never true    Ran Out of  Food in the Last Year: Never true  Transportation Needs: No Transportation Needs (10/12/2023)   PRAPARE - Administrator, Civil Service (Medical): No    Lack of Transportation (Non-Medical): No  Physical Activity: Inactive (10/12/2023)   Exercise Vital Sign    Days of Exercise per Week: 0 days    Minutes of Exercise per Session: 30 min  Stress: No Stress Concern Present (10/12/2023)   [employer] of Occupational Health - Occupational Stress Questionnaire    Feeling of Stress : Not at all  Recent Concern: Stress - Stress Concern Present (08/16/2023)   [employer] of  Occupational Health - Occupational Stress Questionnaire    Feeling of Stress : To some extent  Social Connections: Socially Isolated (10/12/2023)   Social Connection and Isolation Panel [NHANES]    Frequency of Communication with Friends and Family: More than three times a week    Frequency of Social Gatherings with Friends and Family: Once a week    Attends Religious Services: Never    Database Administrator or Organizations: No    Attends Banker Meetings: Never    Marital Status: Never married  Intimate Partner Violence: Not At Risk (03/07/2023)   Humiliation, Afraid, Rape, and Kick questionnaire    Fear of Current or Ex-Partner: No    Emotionally Abused: No    Physically Abused: No    Sexually Abused: No    Outpatient Encounter Medications as of 10/16/2023  Medication Sig   amitriptyline (ELAVIL) 50 MG tablet Take 50 mg by mouth daily.   aspirin EC 81 MG tablet Take 1 tablet by mouth daily.   Cholecalciferol 125 MCG (5000 UT) TABS Take 1 tablet by mouth daily.   citalopram  (CELEXA ) 20 MG tablet Take 1 tablet (20 mg total) by mouth daily. (Patient taking differently: Take 20 mg by mouth 2 (two) times daily.)   furosemide  (LASIX ) 20 MG tablet Take 1 tablet by mouth once daily   levothyroxine  (SYNTHROID ) 75 MCG tablet Take 1 tablet by mouth daily.   lisinopril  (PRINIVIL ,ZESTRIL ) 20 MG tablet Take 1 tablet (20 mg total) by mouth daily.   ondansetron  (ZOFRAN ) 4 MG tablet Take 1 tablet (4 mg total) by mouth every 8 (eight) hours as needed for nausea or vomiting.   predniSONE  (DELTASONE ) 20 MG tablet Take 2 tablets (40 mg total) by mouth daily with breakfast for 5 days.   simvastatin  (ZOCOR ) 20 MG tablet Take 20 mg by mouth at bedtime.   No facility-administered encounter medications on file as of 10/16/2023.    Allergies  Allergen Reactions   Sulfa Antibiotics Hives   Sulfamethoxazole-Trimethoprim Hives   Misc. Sulfonamide Containing Compounds Hives   Sertraline Rash    Sertraline Hcl Rash    Pertinent ROS per HPI, otherwise unremarkable      Objective:  BP (!) 140/87   Pulse 100   Temp (!) 97 F (36.1 C)   Ht 5' 7 (1.702 m)   Wt (!) 300 lb 6.4 oz (136.3 kg)   SpO2 98%   BMI 47.05 kg/m    Wt Readings from Last 3 Encounters:  10/16/23 (!) 300 lb 6.4 oz (136.3 kg)  08/20/23 299 lb 12.8 oz (136 kg)  04/01/23 298 lb 3.2 oz (135.3 kg)    Physical Exam Vitals and nursing note reviewed.  Constitutional:      General: She is not in acute distress.    Appearance: Normal appearance. She is well-developed. She is morbidly obese. She  is not ill-appearing, toxic-appearing or diaphoretic.  HENT:     Head: Normocephalic and atraumatic.     Nose: Nose normal.     Mouth/Throat:     Mouth: Mucous membranes are moist.  Eyes:     Conjunctiva/sclera: Conjunctivae normal.     Pupils: Pupils are equal, round, and reactive to light.  Cardiovascular:     Rate and Rhythm: Normal rate and regular rhythm.  Pulmonary:     Effort: Pulmonary effort is normal.     Breath sounds: Normal breath sounds. No rhonchi or rales.  Abdominal:     General: Abdomen is protuberant. Bowel sounds are normal.     Palpations: Abdomen is soft.     Tenderness: There is no abdominal tenderness.     Hernia: A hernia is present.       Comments: Soft hernia, reducible and nontender.  Musculoskeletal:     Cervical back: Neck supple.     Right upper leg: Normal.     Left upper leg: Normal.     Right knee: Normal range of motion. Tenderness present over the medial joint line. No lateral joint line tenderness.     Instability Tests: Anterior drawer test negative. Posterior drawer test negative. Anterior Lachman test negative. Medial McMurray test negative and lateral McMurray test negative.     Left knee: Normal range of motion. Tenderness present over the medial joint line. No lateral joint line tenderness.     Instability Tests: Anterior drawer test negative. Posterior drawer test  negative. Anterior Lachman test negative. Medial McMurray test negative and lateral McMurray test negative.     Right lower leg: Normal.     Left lower leg: Normal.  Skin:    General: Skin is warm and dry.     Capillary Refill: Capillary refill takes less than 2 seconds.  Neurological:     General: No focal deficit present.     Mental Status: She is alert and oriented to person, place, and time.  Psychiatric:        Mood and Affect: Mood normal.        Thought Content: Thought content normal.        Judgment: Judgment normal.    Physical Exam   ABDOMEN: Soft, reducible hernia on the right side of the abdomen, known to protrude more when agitated. MUSCULOSKELETAL: Pain localized to the medial aspect of the right and left  knee, no deformity.   Results for orders placed or performed during the hospital encounter of 09/16/23  ECHOCARDIOGRAM COMPLETE   Collection Time: 09/16/23  9:10 AM  Result Value Ref Range   Single Plane A2C EF 40.9 %   Single Plane A4C EF 49.8 %   Calc EF 50.2 %   MV VTI 3.70 cm2   Area-P 1/2 3.02 cm2   S' Lateral 2.90 cm   AR max vel 2.58 cm2   AV Area mean vel 2.89 cm2   AV Area VTI 2.69 cm2   Est EF 50 - 55%    AV Peak grad 6.1 mmHg   Ao pk vel 1.23 m/s   AV Mean grad 3.4 mmHg       Pertinent labs & imaging results that were available during my care of the patient were reviewed by me and considered in my medical decision making.  Assessment & Plan:  Wilda was seen today for abdominal pain, knee pain, shortness of breath and leg swelling.  Diagnoses and all orders for this visit:  Dyspnea  on exertion -     CMP14+EGFR  Bilateral swelling of feet and ankles -     CMP14+EGFR  Elevated LFTs -     CMP14+EGFR  Chronic pain of both knees -     predniSONE  (DELTASONE ) 20 MG tablet; Take 2 tablets (40 mg total) by mouth daily with breakfast for 5 days. -     Ambulatory referral to Physical Therapy  Generalized abdominal pain -     Ambulatory  referral to General Surgery  Hernia of abdominal wall -     Ambulatory referral to General Surgery  Nausea and vomiting in adult -     ondansetron  (ZOFRAN ) 4 MG tablet; Take 1 tablet (4 mg total) by mouth every 8 (eight) hours as needed for nausea or vomiting.     Assessment and Plan    Diastolic Dysfunction Diastolic dysfunction noted on echocardiogram. Reports persistent exertional dyspnea and some improvement in leg edema. Currently on Lasix . Appointment with cardiologist Dr. Mylon Paddy on October 27, 2023. - Continue Lasix  - Keep appointment with cardiologist Dr. Mylon Paddy on October 27, 2023  Abdominal Hernia Reports intermittent nausea, vomiting, and a noticeable bulge in the abdominal area that becomes hard and sore when symptomatic. No previous abdominal surgeries. No associated fever, chills, diarrhea, or constipation. Discussed referral to general surgery for evaluation and potential repair. Advised to keep a log of symptoms and potential food triggers. - Refer to general surgery for hernia evaluation and potential repair - Prescribe Zofran  for nausea: take every 8 hours as needed - Advise to keep a log of symptoms and potential food triggers - Follow up with GI for nausea, vomiting, and abdominal pain  Nonalcoholic Fatty Liver Disease (NAFLD) Liver ultrasound shows fatty liver. Does not consume alcohol. Elevated cholesterol noted. Discussed dietary changes to limit fried, greasy, and fatty foods. Emphasized importance of diet, exercise, and weight management. - Provide educational materials on NAFLD - Recommend diet modification to limit fried, greasy, and fatty foods - Encourage exercise and weight management - Follow up with GI for fatty liver disease  Osteoarthritis of the Knees Reports bilateral knee pain, worse with rest and improving with movement. Previous x-ray showed arthritic changes. No history of knee injuries. Symptoms consistent with osteoarthritis.  Discussed initial treatment with prednisone  and physical therapy. If no improvement, consider referral to orthopedic for further options like injections or regenerative medicine. - Send records request to [facility] hospital for previous x-rays - Prescribe prednisone : 2 pills every morning for 5 days - Refer to physical therapy at [facility] - Consider referral to orthopedic if no improvement with physical therapy and anti-inflammatories  General Health Maintenance Due for a colonoscopy in September 2025. - Recommend follow-up with GI for colonoscopy in September 2025  Follow-up - Follow up with CMP today to check liver enzymes and sodium levels - Ensure medications are sent to pharmacy - Print paperwork with educational materials at checkout.        Continue all other maintenance medications.  Follow up plan: Return in about 6 months (around 04/14/2024) for Annual Physical.   Continue healthy lifestyle choices, including diet (rich in fruits, vegetables, and lean proteins, and low in salt and simple carbohydrates) and exercise (at least 30 minutes of moderate physical activity daily).  Educational handout given for fatty liver disease   The above assessment and management plan was discussed with the patient. The patient verbalized understanding of and has agreed to the management plan. Patient is aware to call  the clinic if they develop any new symptoms or if symptoms persist or worsen. Patient is aware when to return to the clinic for a follow-up visit. Patient educated on when it is appropriate to go to the emergency department.   Rosaline Bruns, FNP-C [facility] [phone]

## 2023-10-17 LAB — CMP14+EGFR
ALT: 34 [IU]/L — ABNORMAL HIGH (ref 0–32)
AST: 40 [IU]/L (ref 0–40)
Albumin: 4.3 g/dL (ref 3.8–4.9)
Alkaline Phosphatase: 87 [IU]/L (ref 44–121)
BUN/Creatinine Ratio: 10 (ref 9–23)
BUN: 10 mg/dL (ref 6–24)
Bilirubin Total: 0.5 mg/dL (ref 0.0–1.2)
CO2: 24 mmol/L (ref 20–29)
Calcium: 10.1 mg/dL (ref 8.7–10.2)
Chloride: 101 mmol/L (ref 96–106)
Creatinine, Ser: 1.02 mg/dL — ABNORMAL HIGH (ref 0.57–1.00)
Globulin, Total: 3.3 g/dL (ref 1.5–4.5)
Glucose: 122 mg/dL — ABNORMAL HIGH (ref 70–99)
Potassium: 4.4 mmol/L (ref 3.5–5.2)
Sodium: 140 mmol/L (ref 134–144)
Total Protein: 7.6 g/dL (ref 6.0–8.5)
eGFR: 64 mL/min/{1.73_m2} (ref >59)

## 2023-10-22 ENCOUNTER — Other Ambulatory Visit: Payer: Self-pay

## 2023-10-22 ENCOUNTER — Encounter: Payer: Self-pay | Admitting: Physical Therapy

## 2023-10-22 ENCOUNTER — Ambulatory Visit: Payer: 59 | Attending: Family Medicine | Admitting: Physical Therapy

## 2023-10-22 DIAGNOSIS — M25561 Pain in right knee: Secondary | ICD-10-CM | POA: Insufficient documentation

## 2023-10-22 DIAGNOSIS — M25562 Pain in left knee: Secondary | ICD-10-CM | POA: Diagnosis not present

## 2023-10-22 DIAGNOSIS — M25661 Stiffness of right knee, not elsewhere classified: Secondary | ICD-10-CM | POA: Insufficient documentation

## 2023-10-22 DIAGNOSIS — G8929 Other chronic pain: Secondary | ICD-10-CM | POA: Diagnosis not present

## 2023-10-22 DIAGNOSIS — M25662 Stiffness of left knee, not elsewhere classified: Secondary | ICD-10-CM | POA: Diagnosis not present

## 2023-10-22 NOTE — Therapy (Signed)
 OUTPATIENT PHYSICAL THERAPY LOWER EXTREMITY EVALUATION   Patient Name: Kathy Norman MRN: [id number] DOB:28-Nov-1965, 58 y.o., female Today's Date: 10/22/2023  END OF SESSION:  PT End of Session - 10/22/23 0917     Visit Number 1    Number of Visits 12    Date for PT Re-Evaluation 12/10/23    PT Start Time 0844    PT Stop Time 0914    PT Time Calculation (min) 30 min    Activity Tolerance Patient tolerated treatment well    Behavior During Therapy [facility] for tasks assessed/performed             Past Medical History:  Diagnosis Date   Depression    Hypertension    Past Surgical History:  Procedure Laterality Date   CESAREAN SECTION     GALLBLADDER SURGERY     Patient Active Problem List   Diagnosis Date Noted   Elevated LFTs 10/16/2023   Chronic pain of both knees 10/16/2023   Morbid obesity (HCC) 02/05/2023   Mixed hyperlipidemia 02/05/2023   Vitamin D  deficiency 02/05/2023   OSA (obstructive sleep apnea) 04/06/2018   Major depressive disorder, single episode, unspecified 10/15/2017   Depression 09/27/2016   Insomnia 07/02/2010   Primary hypertension 11/16/2007   Anemia, iron deficiency 07/20/2007   REFERRING PROVIDER: Rock Bruns  REFERRING DIAG: Chronic pain of both knees.  THERAPY DIAG:  Chronic pain of right knee  Acute pain of left knee  Stiffness of right knee, not elsewhere classified  Stiffness of left knee, not elsewhere classified  Rationale for Evaluation and Treatment: Rehabilitation  ONSET DATE: Right knee (10/27/22), left ~2 months.  SUBJECTIVE:   SUBJECTIVE STATEMENT: The patient presents to the clinic with c/o right knee pain that has been ongoing for about a year and about two months for the left knee.  She wants to focus on her PT to be for the right knee.  She has no known mechanism of injury.  Her right knee pain is rated at a 6/10 and can increase with prolonged walking.  Sitting decrease pain.  She just started a 5 day regimen of  Prednisone .  She describes the pain as an ache, sore and throbbing.    PERTINENT HISTORY: Unremarkable. PAIN:  Are you having pain? Yes: NPRS scale: 6/10. Pain location: Right knee. Pain description: As above. Aggravating factors: As above. Relieving factors: As above.    PRECAUTIONS: Other: Pain-free (no pain increase therex).   WEIGHT BEARING RESTRICTIONS: No  FALLS:  Has patient fallen in last 6 months? No  LIVING ENVIRONMENT: Lives in: House/apartment Has following equipment at home: None  OCCUPATION: Disabled.  PLOF: Independent  PATIENT GOALS: Do more without knee pain.   OBJECTIVE:  Note: Objective measures were completed at Evaluation unless otherwise noted.  DIAGNOSTIC FINDINGS: X-ray revealed right knee suprapatellar effusion.    PATIENT SURVEYS:  FOTO 62. EDEMA:   No bilateral knee swelling noted this morning.  PALPATION: CC is pain around periphery of right patella.  LOWER EXTREMITY ROM:  Right knee -5 to 110 degrees and left 0 to 110 degrees.  LOWER EXTREMITY MMT:  Normal right knee strength.  LOWER EXTREMITY SPECIAL TESTS:  Normal right knee stability.  Minimal loss of right patellar mobility.    GAIT: Mild gait antalgia.  TREATMENT DATE:     PATIENT EDUCATION:  Education details: Discussed exercise progression and avoid activities that increase her knee pain. Person educated: Patient Education method: Explanation Education comprehension: verbalized understanding  HOME EXERCISE PROGRAM:   ASSESSMENT:  CLINICAL IMPRESSION: The patient presents to OPPT with a CC of right knee pain that has been ongoing for about a year.  Left knee pain (much less) started about two months ago.  She c/o pain around the periphery of her right knee.  Her right knee active range of motion is -5 to 110 degrees.  She has a minimal  loss of right patellar mobility.  Walking increase her pain.  Her FOTO limitation score is 62.  Patient will benefit from skilled physical therapy intervention to address pain and deficits.  OBJECTIVE IMPAIRMENTS: Abnormal gait, decreased activity tolerance, decreased ROM, and pain.   ACTIVITY LIMITATIONS: locomotion level  PARTICIPATION LIMITATIONS: meal prep, cleaning, laundry, community activity, and yard work  PERSONAL FACTORS: Time since onset of injury/illness/exacerbation are also affecting patient's functional outcome.   REHAB POTENTIAL: Excellent  CLINICAL DECISION MAKING: Stable/uncomplicated  EVALUATION COMPLEXITY: Low   GOALS:  LONG TERM GOALS: Target date: 12/10/23  Ind with a HEP.  Goal status: INITIAL  2.  Perform ADL's with right knee pain not > 3/10.  Goal status: INITIAL  3.  Walk a community distance with pain not > 3/10.  Goal status: INITIAL  PLAN:  PT FREQUENCY: 2x/week  PT DURATION: 6 weeks  PLANNED INTERVENTIONS: 97110-Therapeutic exercises, 97530- Therapeutic activity, [id number]- Neuromuscular re-education, 97535- Self Care, 02859- Manual therapy, 97014- Electrical stimulation (unattended), 97016- Vasopneumatic device, 97035- Ultrasound, Patient/Family education, Cryotherapy, and Moist heat  PLAN FOR NEXT SESSION: Pain-free LE therex(O and CKC), modalities as needed.     Patirica Longshore, PT 10/22/2023, 10:51 AM

## 2023-10-27 ENCOUNTER — Ambulatory Visit: Payer: 59 | Admitting: Internal Medicine

## 2023-10-27 ENCOUNTER — Ambulatory Visit: Payer: 59 | Admitting: Physical Therapy

## 2023-10-27 DIAGNOSIS — M25662 Stiffness of left knee, not elsewhere classified: Secondary | ICD-10-CM | POA: Diagnosis not present

## 2023-10-27 DIAGNOSIS — M25561 Pain in right knee: Secondary | ICD-10-CM | POA: Diagnosis not present

## 2023-10-27 DIAGNOSIS — G8929 Other chronic pain: Secondary | ICD-10-CM | POA: Diagnosis not present

## 2023-10-27 DIAGNOSIS — M25661 Stiffness of right knee, not elsewhere classified: Secondary | ICD-10-CM

## 2023-10-27 DIAGNOSIS — M25562 Pain in left knee: Secondary | ICD-10-CM | POA: Diagnosis not present

## 2023-10-27 NOTE — Therapy (Signed)
 OUTPATIENT PHYSICAL THERAPY LOWER EXTREMITY TREATMENT  Patient Name: Kathy Norman MRN: [id number] DOB:1966-09-21, 58 y.o., female Today's Date: 10/27/2023  END OF SESSION:  PT End of Session - 10/27/23 0827     Visit Number 2    Number of Visits 12    Date for PT Re-Evaluation 12/10/23    PT Start Time 0802    PT Stop Time 0847    PT Time Calculation (min) 45 min    Activity Tolerance Patient tolerated treatment well    Behavior During Therapy [facility] for tasks assessed/performed              Past Medical History:  Diagnosis Date   Depression    Hypertension    Past Surgical History:  Procedure Laterality Date   CESAREAN SECTION     GALLBLADDER SURGERY     Patient Active Problem List   Diagnosis Date Noted   Elevated LFTs 10/16/2023   Chronic pain of both knees 10/16/2023   Morbid obesity (HCC) 02/05/2023   Mixed hyperlipidemia 02/05/2023   Vitamin D  deficiency 02/05/2023   OSA (obstructive sleep apnea) 04/06/2018   Major depressive disorder, single episode, unspecified 10/15/2017   Depression 09/27/2016   Insomnia 07/02/2010   Primary hypertension 11/16/2007   Anemia, iron deficiency 07/20/2007   REFERRING PROVIDER: Rock Bruns  REFERRING DIAG: Chronic pain of both knees.  THERAPY DIAG:  Chronic pain of right knee  Acute pain of left knee  Stiffness of right knee, not elsewhere classified  Stiffness of left knee, not elsewhere classified  Rationale for Evaluation and Treatment: Rehabilitation  ONSET DATE: Right knee (10/27/22), left ~2 months.  SUBJECTIVE:   SUBJECTIVE STATEMENT: Joined a gym and enjoying very much. PERTINENT HISTORY: Unremarkable. PAIN:  Are you having pain? Yes: NPRS scale: 2-3/10. Pain location: Right knee. Pain description: As above. Aggravating factors: As above. Relieving factors: As above.    PRECAUTIONS: Other: Pain-free (no pain increase therex).   WEIGHT BEARING RESTRICTIONS: No  FALLS:  Has patient fallen  in last 6 months? No  LIVING ENVIRONMENT: Lives in: House/apartment Has following equipment at home: None  OCCUPATION: Disabled.  PLOF: Independent  PATIENT GOALS: Do more without knee pain.   OBJECTIVE:  Note: Objective measures were completed at Evaluation unless otherwise noted.  DIAGNOSTIC FINDINGS: X-ray revealed right knee suprapatellar effusion.    PATIENT SURVEYS:  FOTO 62. EDEMA:   No bilateral knee swelling noted this morning.  PALPATION: CC is pain around periphery of right patella.  LOWER EXTREMITY ROM:  Right knee -5 to 110 degrees and left 0 to 110 degrees.  LOWER EXTREMITY MMT:  Normal right knee strength.  LOWER EXTREMITY SPECIAL TESTS:  Normal right knee stability.  Minimal loss of right patellar mobility.    GAIT: Mild gait antalgia.  TREATMENT DATE:     PATIENT EDUCATION:  Education details: Discussed exercise progression and avoid activities that increase her knee pain. Person educated: Patient Education method: Explanation Education comprehension: verbalized understanding  HOME EXERCISE PROGRAM:                                     EXERCISE LOG  Exercise Repetitions and Resistance Comments  Nustep  Level 3 x 15 minutes   Rockerboard In parallel bars x 3 minutes   Knee ext 10# x 3 minutes   Ham curls 30# x 3 minutes       In supine:  Bilateral patellar mobs and gentle endrange flexion stretching (low load long duration stretching technique):  14 minutes total.     ASSESSMENT:  CLINICAL IMPRESSION: The patient did very well with treatment today.  She performed all interventions with excellent technique and without complaint.  OBJECTIVE IMPAIRMENTS: Abnormal gait, decreased activity tolerance, decreased ROM, and pain.   ACTIVITY LIMITATIONS: locomotion level  PARTICIPATION LIMITATIONS: meal prep, cleaning,  laundry, community activity, and yard work  PERSONAL FACTORS: Time since onset of injury/illness/exacerbation are also affecting patient's functional outcome.   REHAB POTENTIAL: Excellent  CLINICAL DECISION MAKING: Stable/uncomplicated  EVALUATION COMPLEXITY: Low   GOALS:  LONG TERM GOALS: Target date: 12/10/23  Ind with a HEP.  Goal status: INITIAL  2.  Perform ADL's with right knee pain not > 3/10.  Goal status: INITIAL  3.  Walk a community distance with pain not > 3/10.  Goal status: INITIAL  PLAN:  PT FREQUENCY: 2x/week  PT DURATION: 6 weeks  PLANNED INTERVENTIONS: 97110-Therapeutic exercises, 97530- Therapeutic activity, [id number]- Neuromuscular re-education, 97535- Self Care, 02859- Manual therapy, 97014- Electrical stimulation (unattended), 97016- Vasopneumatic device, 97035- Ultrasound, Patient/Family education, Cryotherapy, and Moist heat  PLAN FOR NEXT SESSION: Pain-free LE therex(O and CKC), modalities as needed.     Lacharles Altschuler, PT 10/27/2023, 9:15 AM

## 2023-10-29 ENCOUNTER — Encounter: Payer: Self-pay | Admitting: Internal Medicine

## 2023-10-29 ENCOUNTER — Ambulatory Visit: Payer: 59 | Attending: Internal Medicine | Admitting: Internal Medicine

## 2023-10-29 VITALS — BP 138/86 | HR 104 | Ht 67.0 in | Wt 299.8 lb

## 2023-10-29 DIAGNOSIS — R0789 Other chest pain: Secondary | ICD-10-CM | POA: Insufficient documentation

## 2023-10-29 DIAGNOSIS — R0609 Other forms of dyspnea: Secondary | ICD-10-CM | POA: Diagnosis not present

## 2023-10-29 NOTE — Progress Notes (Signed)
 Cardiology Office Note  Date: 10/29/2023   ID: Kathy Norman, DOB Jan 26, 1966, MRN [id number]  PCP:  Galvin Jules, FNP  Cardiologist:  None Electrophysiologist:  None   History of Present Illness: Kathy Norman is a 58 y.o. female known to have HTN was referred to cardiology clinic for evaluation of congestive heart failure.  Patient's BMI is 46.96.  She reported having no DOE with ordinary physical activity.  She did report DOE especially when she carries trash from her apartment to the dumpster or carrying any kind of heavy weight.  But otherwise she is able to perform all her household chores and walk on the flat ground with no symptoms or issues.  She did endorse pain under her left breast, on random occasions, occurs mainly with rest and symptoms with exertion.  However she joined gym last week and started walking for about 45 minutes.  She did not have any symptoms of DOE or chest pain during this time.  He also has some leg swelling bilaterally for which she is currently on p.o. Lasix  20 mg once daily.  Does not have any dizziness, palpitations, syncope.  No orthopnea or PND.  Echocardiogram was performed recently that showed normal LVEF, G1 DD with normal LVEDP and no valvular heart disease.   Past Medical History:  Diagnosis Date   Depression    Hypertension     Past Surgical History:  Procedure Laterality Date   CESAREAN SECTION     GALLBLADDER SURGERY      Current Outpatient Medications  Medication Sig Dispense Refill   amitriptyline (ELAVIL) 50 MG tablet Take 50 mg by mouth daily.     aspirin EC 81 MG tablet Take 1 tablet by mouth daily.     Cholecalciferol 125 MCG (5000 UT) TABS Take 1 tablet by mouth daily.     citalopram  (CELEXA ) 20 MG tablet Take 1 tablet (20 mg total) by mouth daily. (Patient taking differently: Take 20 mg by mouth 2 (two) times daily.) 30 tablet 3   furosemide  (LASIX ) 20 MG tablet Take 1 tablet by mouth once daily 30 tablet 2    levothyroxine  (SYNTHROID ) 75 MCG tablet Take 1 tablet by mouth daily.     lisinopril  (PRINIVIL ,ZESTRIL ) 20 MG tablet Take 1 tablet (20 mg total) by mouth daily. 30 tablet 0   ondansetron  (ZOFRAN ) 4 MG tablet Take 1 tablet (4 mg total) by mouth every 8 (eight) hours as needed for nausea or vomiting. 20 tablet 0   PREDNISONE  PO Take by mouth.     simvastatin  (ZOCOR ) 20 MG tablet Take 20 mg by mouth at bedtime.     No current facility-administered medications for this visit.   Allergies:  Sulfa antibiotics, Sulfamethoxazole-trimethoprim, Misc. sulfonamide containing compounds, Sertraline, and Sertraline hcl   Social History: The patient  reports that she has never smoked. She has never used smokeless tobacco. She reports that she does not drink alcohol and does not use drugs.   Family History: The patient's family history includes Breast cancer in her maternal aunt and maternal grandmother; COPD in her mother and sister; Heart disease in her mother; Skin cancer in her sister; Sleep apnea in her nephew and niece; Throat cancer in her brother and maternal uncle; Thyroid  cancer in her nephew.   ROS:  Please see the history of present illness. Otherwise, complete review of systems is positive for none  All other systems are reviewed and negative.   Physical Exam: VS:  There were no vitals  taken for this visit., BMI There is no height or weight on file to calculate BMI.  Wt Readings from Last 3 Encounters:  10/16/23 (!) 300 lb 6.4 oz (136.3 kg)  08/20/23 299 lb 12.8 oz (136 kg)  04/01/23 298 lb 3.2 oz (135.3 kg)    General: Patient appears comfortable at rest. HEENT: Conjunctiva and lids normal, oropharynx clear with moist mucosa. Neck: Supple, no elevated JVP or carotid bruits, no thyromegaly. Lungs: Clear to auscultation, nonlabored breathing at rest. Cardiac: Regular rate and rhythm, no S3 or significant systolic murmur, no pericardial rub. Abdomen: Soft, nontender, no hepatomegaly, bowel  sounds present, no guarding or rebound. Extremities: No pitting edema, distal pulses 2+. Skin: Warm and dry. Musculoskeletal: No kyphosis. Neuropsychiatric: Alert and oriented x3, affect grossly appropriate.  Recent Labwork: 08/20/2023: BNP 7.2; Hemoglobin 14.2; Platelets 289; TSH 1.540 10/16/2023: ALT 34; AST 40; BUN 10; Creatinine, Ser 1.02; Potassium 4.4; Sodium 140     Component Value Date/Time   CHOL 143 08/20/2023 1123   TRIG 99 08/20/2023 1123   HDL 45 08/20/2023 1123   CHOLHDL 3.2 08/20/2023 1123   LDLCALC 80 08/20/2023 1123     Assessment and Plan:  DOE and bilateral lower extremity swelling likely secondary to deconditioning.  Does not have any symptoms of DOE or angina during exercise (45 minutes of walking in the gym).  Strongly encouraged patient to continue exercise and lose weight.  If she continues to have symptoms of DOE or develop new symptoms of angina after losing weight (BMI less than 40), she is instructed to return to cardiology clinic for further evaluation.  Echocardiogram showed normal LVEF, G1 DD with normal LVEDP and normal heart disease.  CVP 3 mmHg.  She does not have any diastolic dysfunction.    Noncardiac chest pain: Sporadic episodes of chest pain in her left breast mostly at rest and sometimes with exertion.  She did not report any chest pain after she started walking for 45 minutes the gym since last week.  Not true angina.  No further cardiac workup is indicated at this time.  HTN, controlled: Continue lisinopril  20 mg once daily.  Follows with PCP.  HLD, at goal: Continue simvastatin  20 mg at bedtime.  Follows with PCP.      Medication Adjustments/Labs and Tests Ordered: Current medicines are reviewed at length with the patient today.  Concerns regarding medicines are outlined above.    Disposition:  Follow up prn  Signed Keshawna Dix Beauford Bounds, MD, 10/29/2023 1:12 PM    [facility] at [facility] [address] [place],  [place], [place] [address]

## 2023-10-29 NOTE — Patient Instructions (Signed)
 Medication Instructions:  Your physician recommends that you continue on your current medications as directed. Please refer to the Current Medication list given to you today.   Labwork: None  Testing/Procedures: None  Follow-Up: Your physician recommends that you schedule a follow-up appointment in: As needed  Any Other Special Instructions Will Be Listed Below (If Applicable). Thank you for choosing [facility]!      If you need a refill on your cardiac medications before your next appointment, please call your pharmacy.

## 2023-10-30 ENCOUNTER — Ambulatory Visit: Payer: 59 | Admitting: Surgery

## 2023-10-30 DIAGNOSIS — Z1211 Encounter for screening for malignant neoplasm of colon: Secondary | ICD-10-CM | POA: Diagnosis not present

## 2023-10-30 DIAGNOSIS — Z860101 Personal history of adenomatous and serrated colon polyps: Secondary | ICD-10-CM | POA: Diagnosis not present

## 2023-11-03 ENCOUNTER — Ambulatory Visit: Payer: 59 | Admitting: Physical Therapy

## 2023-11-03 DIAGNOSIS — M25661 Stiffness of right knee, not elsewhere classified: Secondary | ICD-10-CM | POA: Diagnosis not present

## 2023-11-03 DIAGNOSIS — M25561 Pain in right knee: Secondary | ICD-10-CM | POA: Diagnosis not present

## 2023-11-03 DIAGNOSIS — M25562 Pain in left knee: Secondary | ICD-10-CM | POA: Diagnosis not present

## 2023-11-03 DIAGNOSIS — G8929 Other chronic pain: Secondary | ICD-10-CM | POA: Diagnosis not present

## 2023-11-03 DIAGNOSIS — M25662 Stiffness of left knee, not elsewhere classified: Secondary | ICD-10-CM | POA: Diagnosis not present

## 2023-11-03 NOTE — Therapy (Signed)
OUTPATIENT PHYSICAL THERAPY LOWER EXTREMITY TREATMENT  Patient Name: Kathy Norman MRN: [id number] DOB:1966-01-18, 58 y.o., female Today's Date: 11/03/2023  END OF SESSION:  PT End of Session - 11/03/23 0810     Visit Number 3    Number of Visits 12    Date for PT Re-Evaluation 12/10/23    PT Start Time 0801    PT Stop Time 0843    PT Time Calculation (min) 42 min    Activity Tolerance Patient tolerated treatment well    Behavior During Therapy [facility] for tasks assessed/performed              Past Medical History:  Diagnosis Date   Depression    Hypertension    Past Surgical History:  Procedure Laterality Date   CESAREAN SECTION     GALLBLADDER SURGERY     Patient Active Problem List   Diagnosis Date Noted   DOE (dyspnea on exertion) 10/29/2023   Non-cardiac chest pain 10/29/2023   Elevated LFTs 10/16/2023   Chronic pain of both knees 10/16/2023   Morbid obesity (HCC) 02/05/2023   Mixed hyperlipidemia 02/05/2023   Vitamin D deficiency 02/05/2023   OSA (obstructive sleep apnea) 04/06/2018   Major depressive disorder, single episode, unspecified 10/15/2017   Depression 09/27/2016   Insomnia 07/02/2010   Primary hypertension 11/16/2007   Anemia, iron deficiency 07/20/2007   REFERRING PROVIDER: Gilford Silvius  REFERRING DIAG: Chronic pain of both knees.  THERAPY DIAG:  Chronic pain of right knee  Acute pain of left knee  Stiffness of right knee, not elsewhere classified  Stiffness of left knee, not elsewhere classified  Rationale for Evaluation and Treatment: Rehabilitation  ONSET DATE: Right knee (10/27/22), left ~2 months.  SUBJECTIVE:   SUBJECTIVE STATEMENT: Doing good.  Pain at about a 2. PERTINENT HISTORY: Unremarkable. PAIN:  Are you having pain? Yes: NPRS scale: 2/10. Pain location: Right knee. Pain description: As above. Aggravating factors: As above. Relieving factors: As above.    PRECAUTIONS: Other: Pain-free (no pain increase  therex).   WEIGHT BEARING RESTRICTIONS: No  FALLS:  Has patient fallen in last 6 months? No  LIVING ENVIRONMENT: Lives in: House/apartment Has following equipment at home: None  OCCUPATION: Disabled.  PLOF: Independent  PATIENT GOALS: Do more without knee pain.   OBJECTIVE:  Note: Objective measures were completed at Evaluation unless otherwise noted.  DIAGNOSTIC FINDINGS: X-ray revealed right knee suprapatellar effusion.    PATIENT SURVEYS:  FOTO 62. EDEMA:   No bilateral knee swelling noted this morning.  PALPATION: CC is pain around periphery of right patella.  LOWER EXTREMITY ROM:  Right knee -5 to 110 degrees and left 0 to 110 degrees.  LOWER EXTREMITY MMT:  Normal right knee strength.  LOWER EXTREMITY SPECIAL TESTS:  Normal right knee stability.  Minimal loss of right patellar mobility.    GAIT: Mild gait antalgia.  TREATMENT DATE:     PATIENT EDUCATION:  Education details: Discussed exercise progression and avoid activities that increase her knee pain. Person educated: Patient Education method: Explanation Education comprehension: verbalized understanding  HOME EXERCISE PROGRAM:                                      EXERCISE LOG  Exercise Repetitions and Resistance Comments  Nustep Level 4 x 15 minutes   Rockerboard 3 minutes   Knee ext 10# x 3 minutes   Ham curls 30# x 3 minutes   Continued with bilateral patellar mobs and gentle endrange flexion stretching (low load long duration stretching technique):  14 minutes total.                                      EXERCISE LOG  Exercise Repetitions and Resistance Comments  Nustep  Level 3 x 15 minutes   Rockerboard In parallel bars x 3 minutes   Knee ext 10# x 3 minutes   Ham curls 30# x 3 minutes       In supine:  Bilateral patellar mobs and gentle endrange flexion  stretching (low load long duration stretching technique):  14 minutes total.     ASSESSMENT:  CLINICAL IMPRESSION: Patient is doing very well with a lowered pain-level.    OBJECTIVE IMPAIRMENTS: Abnormal gait, decreased activity tolerance, decreased ROM, and pain.   ACTIVITY LIMITATIONS: locomotion level  PARTICIPATION LIMITATIONS: meal prep, cleaning, laundry, community activity, and yard work  PERSONAL FACTORS: Time since onset of injury/illness/exacerbation are also affecting patient's functional outcome.   REHAB POTENTIAL: Excellent  CLINICAL DECISION MAKING: Stable/uncomplicated  EVALUATION COMPLEXITY: Low   GOALS:  LONG TERM GOALS: Target date: 12/10/23  Ind with a HEP.  Goal status: INITIAL  2.  Perform ADL's with right knee pain not > 3/10.  Goal status: INITIAL  3.  Walk a community distance with pain not > 3/10.  Goal status: INITIAL  PLAN:  PT FREQUENCY: 2x/week  PT DURATION: 6 weeks  PLANNED INTERVENTIONS: 97110-Therapeutic exercises, 97530- Therapeutic activity, [id number]- Neuromuscular re-education, 97535- Self Care, 74128- Manual therapy, 97014- Electrical stimulation (unattended), 97016- Vasopneumatic device, 97035- Ultrasound, Patient/Family education, Cryotherapy, and Moist heat  PLAN FOR NEXT SESSION: Pain-free LE therex(O and CKC), modalities as needed.     Charlottie Peragine, [place], PT 11/03/2023, 8:43 AM

## 2023-11-10 ENCOUNTER — Ambulatory Visit: Payer: 59

## 2023-11-10 DIAGNOSIS — M25561 Pain in right knee: Secondary | ICD-10-CM | POA: Diagnosis not present

## 2023-11-10 DIAGNOSIS — M25562 Pain in left knee: Secondary | ICD-10-CM

## 2023-11-10 DIAGNOSIS — M25661 Stiffness of right knee, not elsewhere classified: Secondary | ICD-10-CM

## 2023-11-10 DIAGNOSIS — M25662 Stiffness of left knee, not elsewhere classified: Secondary | ICD-10-CM | POA: Diagnosis not present

## 2023-11-10 DIAGNOSIS — G8929 Other chronic pain: Secondary | ICD-10-CM | POA: Diagnosis not present

## 2023-11-10 NOTE — Therapy (Signed)
OUTPATIENT PHYSICAL THERAPY LOWER EXTREMITY TREATMENT  Patient Name: Kathy Norman MRN: [id number] DOB:09-Jan-1966, 58 y.o., female Today's Date: 11/10/2023  END OF SESSION:  PT End of Session - 11/10/23 0812     Visit Number 4    Number of Visits 12    Date for PT Re-Evaluation 12/10/23    PT Start Time 0800    PT Stop Time 0843    PT Time Calculation (min) 43 min    Activity Tolerance Patient tolerated treatment well    Behavior During Therapy [facility] for tasks assessed/performed               Past Medical History:  Diagnosis Date   Depression    Hypertension    Past Surgical History:  Procedure Laterality Date   CESAREAN SECTION     GALLBLADDER SURGERY     Patient Active Problem List   Diagnosis Date Noted   DOE (dyspnea on exertion) 10/29/2023   Non-cardiac chest pain 10/29/2023   Elevated LFTs 10/16/2023   Chronic pain of both knees 10/16/2023   Morbid obesity (HCC) 02/05/2023   Mixed hyperlipidemia 02/05/2023   Vitamin D deficiency 02/05/2023   OSA (obstructive sleep apnea) 04/06/2018   Major depressive disorder, single episode, unspecified 10/15/2017   Depression 09/27/2016   Insomnia 07/02/2010   Primary hypertension 11/16/2007   Anemia, iron deficiency 07/20/2007   REFERRING PROVIDER: Gilford Silvius  REFERRING DIAG: Chronic pain of both knees.  THERAPY DIAG:  Chronic pain of right knee  Acute pain of left knee  Stiffness of right knee, not elsewhere classified  Stiffness of left knee, not elsewhere classified  Rationale for Evaluation and Treatment: Rehabilitation  ONSET DATE: Right knee (10/27/22), left ~2 months.  SUBJECTIVE:   SUBJECTIVE STATEMENT: Patient reports that she is not hurting today.   PERTINENT HISTORY: Unremarkable. PAIN:  Are you having pain? Yes: NPRS scale: 0/10. Pain location: Right knee. Pain description: As above. Aggravating factors: As above. Relieving factors: As above.    PRECAUTIONS: Other: Pain-free (no  pain increase therex).   WEIGHT BEARING RESTRICTIONS: No  FALLS:  Has patient fallen in last 6 months? No  LIVING ENVIRONMENT: Lives in: House/apartment Has following equipment at home: None  OCCUPATION: Disabled.  PLOF: Independent  PATIENT GOALS: Do more without knee pain.   OBJECTIVE:  Note: Objective measures were completed at Evaluation unless otherwise noted.  DIAGNOSTIC FINDINGS: X-ray revealed right knee suprapatellar effusion.    PATIENT SURVEYS:  FOTO 64.20 EDEMA:   No bilateral knee swelling noted this morning.  PALPATION: CC is pain around periphery of right patella.  LOWER EXTREMITY ROM:  Right knee -5 to 110 degrees and left 0 to 110 degrees.  LOWER EXTREMITY MMT:  Normal right knee strength.  LOWER EXTREMITY SPECIAL TESTS:  Normal right knee stability.  Minimal loss of right patellar mobility.    GAIT: Mild gait antalgia.  PATIENT EDUCATION:  Education details: Discussed exercise progression and avoid activities that increase her knee pain. Person educated: Patient Education method: Explanation Education comprehension: verbalized understanding  HOME EXERCISE PROGRAM:  TREATMENT DATE:                                     11/10/23 EXERCISE LOG  Exercise Repetitions and Resistance Comments  Nustep  L3-5 x 17 minutes   Cybex knee flexion  40# x 3 minutes   Cybex knee extension 20# x 3 minutes   Rocker board  4 minutes   Lateral step up  6" step x 2 minutes    Tandem walking on foam 2 minutes  BUE support  Sit to stand  20 reps  From lowered mat table    Blank cell = exercise not performed today                                    11/03/23 EXERCISE LOG  Exercise Repetitions and Resistance Comments  Nustep Level 4 x 15 minutes   Rockerboard 3 minutes   Knee ext 10# x 3 minutes   Ham curls 30# x 3 minutes    Continued with bilateral patellar mobs and gentle endrange flexion stretching (low load long duration stretching technique):  14 minutes total.                                     10/27/23 EXERCISE LOG  Exercise Repetitions and Resistance Comments  Nustep  Level 3 x 15 minutes   Rockerboard In parallel bars x 3 minutes   Knee ext 10# x 3 minutes   Ham curls 30# x 3 minutes       In supine:  Bilateral patellar mobs and gentle endrange flexion stretching (low load long duration stretching technique):  14 minutes total.     ASSESSMENT:  CLINICAL IMPRESSION: Patient was progressed with multiple new and familiar interventions for improved lower extremity strength needed for improved function with her daily activities. She required minimal cueing with weighted knee extension for a full arc of motion. She experienced no increase in pain or discomfort with any of today's interventions. She reported that her knees felt good upon the conclusion of treatment. She continues to require skilled physical therapy to address her remaining impairments to return to her prior level of function.   OBJECTIVE IMPAIRMENTS: Abnormal gait, decreased activity tolerance, decreased ROM, and pain.   ACTIVITY LIMITATIONS: locomotion level  PARTICIPATION LIMITATIONS: meal prep, cleaning, laundry, community activity, and yard work  PERSONAL FACTORS: Time since onset of injury/illness/exacerbation are also affecting patient's functional outcome.   REHAB POTENTIAL: Excellent  CLINICAL DECISION MAKING: Stable/uncomplicated  EVALUATION COMPLEXITY: Low   GOALS:  LONG TERM GOALS: Target date: 12/10/23  Ind with a HEP.  Goal status: INITIAL  2.  Perform ADL's with right knee pain not > 3/10.  Goal status: INITIAL  3.  Walk a community distance with pain not > 3/10.  Goal status: INITIAL  PLAN:  PT FREQUENCY: 2x/week  PT DURATION: 6 weeks  PLANNED INTERVENTIONS: 97110-Therapeutic exercises, 97530-  Therapeutic activity, [id number]- Neuromuscular re-education, 97535- Self Care, 81191- Manual therapy, 97014- Electrical stimulation (unattended), 97016- Vasopneumatic device, [id number]- Ultrasound, Patient/Family education, Cryotherapy, and Moist heat  PLAN FOR NEXT SESSION: Pain-free LE therex(O and CKC), modalities as needed.     Granville Lewis, PT 11/10/2023, 8:45 AM

## 2023-11-14 DIAGNOSIS — Z79899 Other long term (current) drug therapy: Secondary | ICD-10-CM | POA: Diagnosis not present

## 2023-11-14 DIAGNOSIS — D123 Benign neoplasm of transverse colon: Secondary | ICD-10-CM | POA: Diagnosis not present

## 2023-11-14 DIAGNOSIS — K620 Anal polyp: Secondary | ICD-10-CM | POA: Diagnosis not present

## 2023-11-14 DIAGNOSIS — I5030 Unspecified diastolic (congestive) heart failure: Secondary | ICD-10-CM | POA: Diagnosis not present

## 2023-11-14 DIAGNOSIS — K635 Polyp of colon: Secondary | ICD-10-CM | POA: Diagnosis not present

## 2023-11-14 DIAGNOSIS — E039 Hypothyroidism, unspecified: Secondary | ICD-10-CM | POA: Diagnosis not present

## 2023-11-14 DIAGNOSIS — K648 Other hemorrhoids: Secondary | ICD-10-CM | POA: Diagnosis not present

## 2023-11-14 DIAGNOSIS — Z7982 Long term (current) use of aspirin: Secondary | ICD-10-CM | POA: Diagnosis not present

## 2023-11-14 DIAGNOSIS — E785 Hyperlipidemia, unspecified: Secondary | ICD-10-CM | POA: Diagnosis not present

## 2023-11-14 DIAGNOSIS — G4733 Obstructive sleep apnea (adult) (pediatric): Secondary | ICD-10-CM | POA: Diagnosis not present

## 2023-11-14 DIAGNOSIS — K760 Fatty (change of) liver, not elsewhere classified: Secondary | ICD-10-CM | POA: Diagnosis not present

## 2023-11-14 DIAGNOSIS — D125 Benign neoplasm of sigmoid colon: Secondary | ICD-10-CM | POA: Diagnosis not present

## 2023-11-14 DIAGNOSIS — Z860101 Personal history of adenomatous and serrated colon polyps: Secondary | ICD-10-CM | POA: Diagnosis not present

## 2023-11-14 DIAGNOSIS — K644 Residual hemorrhoidal skin tags: Secondary | ICD-10-CM | POA: Diagnosis not present

## 2023-11-14 DIAGNOSIS — I10 Essential (primary) hypertension: Secondary | ICD-10-CM | POA: Diagnosis not present

## 2023-11-14 DIAGNOSIS — Z87442 Personal history of urinary calculi: Secondary | ICD-10-CM | POA: Diagnosis not present

## 2023-11-14 DIAGNOSIS — Z882 Allergy status to sulfonamides status: Secondary | ICD-10-CM | POA: Diagnosis not present

## 2023-11-14 DIAGNOSIS — M51369 Other intervertebral disc degeneration, lumbar region without mention of lumbar back pain or lower extremity pain: Secondary | ICD-10-CM | POA: Diagnosis not present

## 2023-11-14 DIAGNOSIS — Z1211 Encounter for screening for malignant neoplasm of colon: Secondary | ICD-10-CM | POA: Diagnosis not present

## 2023-11-14 DIAGNOSIS — I110 Hypertensive heart disease with heart failure: Secondary | ICD-10-CM | POA: Diagnosis not present

## 2023-11-14 DIAGNOSIS — D126 Benign neoplasm of colon, unspecified: Secondary | ICD-10-CM | POA: Diagnosis not present

## 2023-11-16 ENCOUNTER — Other Ambulatory Visit: Payer: Self-pay | Admitting: Family Medicine

## 2023-11-16 DIAGNOSIS — M25475 Effusion, left foot: Secondary | ICD-10-CM

## 2023-11-17 ENCOUNTER — Other Ambulatory Visit: Payer: 59

## 2023-11-17 ENCOUNTER — Ambulatory Visit: Payer: 59 | Attending: Family Medicine

## 2023-11-17 DIAGNOSIS — R7989 Other specified abnormal findings of blood chemistry: Secondary | ICD-10-CM | POA: Diagnosis not present

## 2023-11-17 DIAGNOSIS — M25562 Pain in left knee: Secondary | ICD-10-CM | POA: Diagnosis not present

## 2023-11-17 DIAGNOSIS — G8929 Other chronic pain: Secondary | ICD-10-CM

## 2023-11-17 DIAGNOSIS — M25662 Stiffness of left knee, not elsewhere classified: Secondary | ICD-10-CM

## 2023-11-17 DIAGNOSIS — M25561 Pain in right knee: Secondary | ICD-10-CM | POA: Insufficient documentation

## 2023-11-17 DIAGNOSIS — M25661 Stiffness of right knee, not elsewhere classified: Secondary | ICD-10-CM | POA: Diagnosis not present

## 2023-11-17 LAB — CMP14+EGFR
ALT: 41 [IU]/L — ABNORMAL HIGH (ref 0–32)
AST: 40 [IU]/L (ref 0–40)
Albumin: 4 g/dL (ref 3.8–4.9)
Alkaline Phosphatase: 80 [IU]/L (ref 44–121)
BUN/Creatinine Ratio: 7 — ABNORMAL LOW (ref 9–23)
BUN: 7 mg/dL (ref 6–24)
Bilirubin Total: 0.5 mg/dL (ref 0.0–1.2)
CO2: 23 mmol/L (ref 20–29)
Calcium: 9.5 mg/dL (ref 8.7–10.2)
Chloride: 103 mmol/L (ref 96–106)
Creatinine, Ser: 1.07 mg/dL — ABNORMAL HIGH (ref 0.57–1.00)
Globulin, Total: 2.8 g/dL (ref 1.5–4.5)
Glucose: 122 mg/dL — ABNORMAL HIGH (ref 70–99)
Potassium: 3.8 mmol/L (ref 3.5–5.2)
Sodium: 143 mmol/L (ref 134–144)
Total Protein: 6.8 g/dL (ref 6.0–8.5)
eGFR: 61 mL/min/{1.73_m2} (ref >59)

## 2023-11-17 NOTE — Therapy (Addendum)
 OUTPATIENT PHYSICAL THERAPY LOWER EXTREMITY TREATMENT  Patient Name: Kathy Norman MRN: [id number] DOB:06/08/1966, 58 y.o., female Today's Date: 11/17/2023  END OF SESSION:  PT End of Session - 11/17/23 0804     Visit Number 5    Number of Visits 12    Date for PT Re-Evaluation 12/10/23    PT Start Time 0800    PT Stop Time 0845    PT Time Calculation (min) 45 min    Activity Tolerance Patient tolerated treatment well    Behavior During Therapy [facility] for tasks assessed/performed                Past Medical History:  Diagnosis Date   Depression    Hypertension    Past Surgical History:  Procedure Laterality Date   CESAREAN SECTION     GALLBLADDER SURGERY     Patient Active Problem List   Diagnosis Date Noted   DOE (dyspnea on exertion) 10/29/2023   Non-cardiac chest pain 10/29/2023   Elevated LFTs 10/16/2023   Chronic pain of both knees 10/16/2023   Morbid obesity (HCC) 02/05/2023   Mixed hyperlipidemia 02/05/2023   Vitamin D deficiency 02/05/2023   OSA (obstructive sleep apnea) 04/06/2018   Major depressive disorder, single episode, unspecified 10/15/2017   Depression 09/27/2016   Insomnia 07/02/2010   Primary hypertension 11/16/2007   Anemia, iron deficiency 07/20/2007   REFERRING PROVIDER: Gilford Silvius  REFERRING DIAG: Chronic pain of both knees.  THERAPY DIAG:  Chronic pain of right knee  Acute pain of left knee  Stiffness of right knee, not elsewhere classified  Stiffness of left knee, not elsewhere classified  Rationale for Evaluation and Treatment: Rehabilitation  ONSET DATE: Right knee (10/27/22), left ~2 months.  SUBJECTIVE:   SUBJECTIVE STATEMENT: Patient reports that her right knee is really hurting since her last appointment. She feels like she had not gone to therapy any as it is hurting like it was before.   PERTINENT HISTORY: Unremarkable. PAIN:  Are you having pain? Yes: NPRS scale: 8/10. Pain location: Right knee. Pain  description: As above. Aggravating factors: As above. Relieving factors: As above.    PRECAUTIONS: Other: Pain-free (no pain increase therex).   WEIGHT BEARING RESTRICTIONS: No  FALLS:  Has patient fallen in last 6 months? No  LIVING ENVIRONMENT: Lives in: House/apartment Has following equipment at home: None  OCCUPATION: Disabled.  PLOF: Independent  PATIENT GOALS: Do more without knee pain.   OBJECTIVE:  Note: Objective measures were completed at Evaluation unless otherwise noted.  DIAGNOSTIC FINDINGS: X-ray revealed right knee suprapatellar effusion.    PATIENT SURVEYS:  FOTO 64.20 EDEMA:   No bilateral knee swelling noted this morning.  PALPATION: CC is pain around periphery of right patella.  LOWER EXTREMITY ROM:  Right knee -5 to 110 degrees and left 0 to 110 degrees.  LOWER EXTREMITY MMT:  Normal right knee strength.  LOWER EXTREMITY SPECIAL TESTS:  Normal right knee stability.  Minimal loss of right patellar mobility.    GAIT: Mild gait antalgia.            PATIENT EDUCATION:  Education details: activity modification and going to the gym Person educated: Patient Education method: Explanation Education comprehension: verbalized understanding  HOME EXERCISE PROGRAM:  TREATMENT DATE:                                     11/17/23 EXERCISE LOG  Exercise  Repetitions and Resistance Comments  Nustep  L1 x 18 minutes    Blank cell = exercise not performed today  Manual Therapy Soft Tissue Mobilization: right quadriceps and hip adductors, for reduced pain Joint Mobilizations: R patellar, grade I-III                                     11/10/23 EXERCISE LOG  Exercise Repetitions and Resistance Comments  Nustep  L3-5 x 17 minutes   Cybex knee flexion  40# x 3 minutes   Cybex knee extension 20# x 3 minutes   Rocker board  4 minutes   Lateral step up  6" step x 2 minutes    Tandem walking on foam 2 minutes  BUE support  Sit to stand  20 reps  From  lowered mat table    Blank cell = exercise not performed today                                    11/03/23 EXERCISE LOG  Exercise Repetitions and Resistance Comments  Nustep Level 4 x 15 minutes   Rockerboard 3 minutes   Knee ext 10# x 3 minutes   Ham curls 30# x 3 minutes   Continued with bilateral patellar mobs and gentle endrange flexion stretching (low load long duration stretching technique):  14 minutes total.    ASSESSMENT:  CLINICAL IMPRESSION: Patient presented with high right knee pain severity and irritability which limited her ability to be introduced to new interventions. Today's treatment focused on manual therapy as this was the most effective at reducing her familiar symptoms. She was also able to demonstrate improved right knee arc of motion after manual therapy.  She reported that her knee felt better as her pain was only a 6/10 upon the conclusion of treatment. She continues to require skilled physical therapy to address her remaining impairments to maximize her functional mobility.   OBJECTIVE IMPAIRMENTS: Abnormal gait, decreased activity tolerance, decreased ROM, and pain.   ACTIVITY LIMITATIONS: locomotion level  PARTICIPATION LIMITATIONS: meal prep, cleaning, laundry, community activity, and yard work  PERSONAL FACTORS: Time since onset of injury/illness/exacerbation are also affecting patient's functional outcome.   REHAB POTENTIAL: Excellent  CLINICAL DECISION MAKING: Stable/uncomplicated  EVALUATION COMPLEXITY: Low   GOALS:  LONG TERM GOALS: Target date: 12/10/23  Ind with a HEP.  Goal status: INITIAL  2.  Perform ADL's with right knee pain not > 3/10.  Goal status: INITIAL  3.  Walk a community distance with pain not > 3/10.  Goal status: INITIAL  PLAN:  PT FREQUENCY: 2x/week  PT DURATION: 6 weeks  PLANNED INTERVENTIONS: 97110-Therapeutic exercises, 97530- Therapeutic activity, [id number]- Neuromuscular re-education, 97535- Self Care, 16109-  Manual therapy, 97014- Electrical stimulation (unattended), 97016- Vasopneumatic device, 97035- Ultrasound, Patient/Family education, Cryotherapy, and Moist heat  PLAN FOR NEXT SESSION: Pain-free LE therex(O and CKC), modalities as needed.     Granville Lewis, PT 11/17/2023, 8:53 AM

## 2023-11-24 ENCOUNTER — Encounter: Payer: 59 | Admitting: Physical Therapy

## 2023-12-02 ENCOUNTER — Other Ambulatory Visit: Payer: Self-pay | Admitting: Family Medicine

## 2023-12-02 DIAGNOSIS — D126 Benign neoplasm of colon, unspecified: Secondary | ICD-10-CM | POA: Insufficient documentation

## 2023-12-02 DIAGNOSIS — I10 Essential (primary) hypertension: Secondary | ICD-10-CM

## 2023-12-02 MED ORDER — SIMVASTATIN 20 MG PO TABS: 20.0000 mg | ORAL_TABLET | Freq: Every day | ORAL | 1 refills | Status: DC

## 2023-12-02 MED ORDER — LISINOPRIL 20 MG PO TABS: 20.0000 mg | ORAL_TABLET | Freq: Every day | ORAL | 1 refills | Status: DC

## 2023-12-02 MED ORDER — LEVOTHYROXINE SODIUM 75 MCG PO TABS: 75.0000 ug | ORAL_TABLET | Freq: Every day | ORAL | 1 refills | Status: DC

## 2023-12-02 NOTE — Telephone Encounter (Signed)
 Copied from CRM [id number]. Topic: Clinical - Medication Refill >> Dec 02, 2023 10:04 AM Carlatta H wrote: Most Recent Primary Care Visit:  Provider: [facility]  Department: [facility]  Visit Type: LAB VISIT  Date: 11/17/2023  Medication:  (PRINIlevothyroxine (SYNTHROID) 75 MCG tablet [[id number]]VIL,ZESTRIL) 20 MG tablet [[id number]] simvastatin (ZOCOR) 20 MG tablet [[id number]] lisinopril (PRINIVIL,ZESTRIL) 20 MG tablet [[id number]]    Has the patient contacted their pharmacy? Yes (Agent: If no, request that the patient contact the pharmacy for the refill. If patient does not wish to contact the pharmacy document the reason why and proceed with request.) (Agent: If yes, when and what did the pharmacy advise?)  Is this the correct pharmacy for this prescription? Yes If no, delete pharmacy and type the correct one.  This is the patient's preferred pharmacy:  [facility] [address] [place] - [address] [place] [address] [place] [address] Phone: [phone] Fax: [phone]   Has the prescription been filled recently? No  Is the patient out of the medication? Yes  Has the patient been seen for an appointment in the last year OR does the patient have an upcoming appointment? Yes  Can we respond through MyChart? No  Agent: Please be advised that Rx refills may take up to 3 business days. We ask that you follow-up with your pharmacy.

## 2023-12-18 DIAGNOSIS — G4733 Obstructive sleep apnea (adult) (pediatric): Secondary | ICD-10-CM | POA: Diagnosis not present

## 2024-03-09 DIAGNOSIS — G4733 Obstructive sleep apnea (adult) (pediatric): Secondary | ICD-10-CM | POA: Diagnosis not present

## 2024-03-10 ENCOUNTER — Ambulatory Visit: Payer: 59

## 2024-03-10 VITALS — BP 138/86 | HR 104 | Ht 67.0 in | Wt 299.0 lb

## 2024-03-10 DIAGNOSIS — Z0000 Encounter for general adult medical examination without abnormal findings: Secondary | ICD-10-CM

## 2024-03-10 NOTE — Patient Instructions (Signed)
 [name] , Thank you for taking time out of your busy schedule to complete your Annual Wellness Visit with me. I enjoyed our conversation and look forward to speaking with you again next year. I, as well as your care team,  appreciate your ongoing commitment to your health goals. Please review the following plan we discussed and let me know if I can assist you in the future. Your Game plan/ To Do List   Follow up Visits: Next Medicare AWV with our clinical staff: 03/11/25 at 9:20a.m.   Next Office Visit with your provider: 04/28/24 at 8:05a.m.  Clinician Recommendations:  Aim for 30 minutes of exercise or brisk walking, 6-8 glasses of water, and 5 servings of fruits and vegetables each day. Please remember to get the following done at your next visit with your provider: Shingles vaccines, Pap, HIV & Hep C screening done. If you have any questions please contact our office at [phone].      This is a list of the screening recommended for you and due dates:  Health Maintenance  Topic Date Due   HIV Screening  Never done   Hepatitis C Screening  Never done   Zoster (Shingles) Vaccine (1 of 2) Never done   Pap with HPV screening  Never done   COVID-19 Vaccine (4 - 2024-25 season) 03/26/2025*   Flu Shot  05/14/2024   Mammogram  03/04/2025   Medicare Annual Wellness Visit  03/10/2025   Colon Cancer Screening  11/13/2028   DTaP/Tdap/Td vaccine (3 - Td or Tdap) 03/01/2034   HPV Vaccine  Aged Out   Meningitis B Vaccine  Aged Out  *Topic was postponed. The date shown is not the original due date.    Advanced directives: (Declined) Advance directive discussed with you today. Even though you declined this today, please call our office should you change your mind, and we can give you the proper paperwork for you to fill out. Advance Care Planning is important because it:  [x]  Makes sure you receive the medical care that is consistent with your values, goals, and preferences  [x]  It provides  guidance to your family and loved ones and reduces their decisional burden about whether or not they are making the right decisions based on your wishes.  Follow the link provided in your after visit summary or read over the paperwork we have mailed to you to help you started getting your Advance Directives in place. If you need assistance in completing these, please reach out to us  so that we can help you!  See attachments for Preventive Care and Fall Prevention Tips.

## 2024-03-10 NOTE — Progress Notes (Signed)
 Subjective:   Kathy Norman is a 58 y.o. who presents for a Medicare Wellness preventive visit.  As a reminder, Annual Wellness Visits don't include a physical exam, and some assessments may be limited, especially if this visit is performed virtually. We may recommend an in-person follow-up visit with your provider if needed.  Visit Complete: Virtual I connected with  Chloeanne Lemire on 03/10/24 by a audio enabled telemedicine application and verified that I am speaking with the correct person using two identifiers.  Patient Location: Home  Provider Location: Home Office  I discussed the limitations of evaluation and management by telemedicine. The patient expressed understanding and agreed to proceed.  Vital Signs: Because this visit was a virtual/telehealth visit, some criteria may be missing or patient reported. Any vitals not documented were not able to be obtained and vitals that have been documented are patient reported.  VideoDeclined- This patient declined Librarian, academic. Therefore the visit was completed with audio only.  Persons Participating in Visit: Patient.  AWV Questionnaire: Yes: Patient Medicare AWV questionnaire was completed by the patient on 03/06/24; I have confirmed that all information answered by patient is correct and no changes since this date.  Cardiac Risk Factors include: advanced age (>77men, >63 women);dyslipidemia;hypertension;obesity (BMI >30kg/m2)     Objective:     Today's Vitals   03/10/24 0856  BP: 138/86  Pulse: (!) 104  Weight: 299 lb (135.6 kg)  Height: 5\' 7"  (1.702 m)   Body mass index is 46.83 kg/m.     03/10/2024    8:47 AM 10/22/2023    9:23 AM 03/07/2023   11:21 AM  Advanced Directives  Does Patient Have a Medical Advance Directive? No No No  Would patient like information on creating a medical advance directive?   No - Patient declined    Current Medications (verified) Outpatient Encounter  Medications as of 03/10/2024  Medication Sig   amitriptyline (ELAVIL) 50 MG tablet Take 50 mg by mouth daily.   aspirin EC 81 MG tablet Take 1 tablet by mouth daily.   Cholecalciferol 125 MCG (5000 UT) TABS Take 1 tablet by mouth daily.   citalopram  (CELEXA ) 20 MG tablet Take 1 tablet (20 mg total) by mouth daily. (Patient taking differently: Take 20 mg by mouth 2 (two) times daily.)   furosemide  (LASIX ) 20 MG tablet Take 1 tablet by mouth once daily   levothyroxine  (SYNTHROID ) 75 MCG tablet Take 1 tablet (75 mcg total) by mouth daily.   lisinopril  (ZESTRIL ) 20 MG tablet Take 1 tablet (20 mg total) by mouth daily.   ondansetron  (ZOFRAN ) 4 MG tablet Take 1 tablet (4 mg total) by mouth every 8 (eight) hours as needed for nausea or vomiting.   PREDNISONE  PO Take by mouth.   simvastatin  (ZOCOR ) 20 MG tablet Take 1 tablet (20 mg total) by mouth at bedtime.   No facility-administered encounter medications on file as of 03/10/2024.    Allergies (verified) Sulfa antibiotics, Sulfamethoxazole-trimethoprim, Misc. sulfonamide containing compounds, Sertraline, and Sertraline hcl   History: Past Medical History:  Diagnosis Date   Allergy 06/2021   Sulfate   Depression    Hypertension    Thyroid  disease    Past Surgical History:  Procedure Laterality Date   CESAREAN SECTION     CHOLECYSTECTOMY  03/1999   GALLBLADDER SURGERY     Family History  Problem Relation Age of Onset   Heart disease Mother    COPD Mother    COPD Sister  Skin cancer Sister    Throat cancer Brother    Breast cancer Maternal Aunt    Throat cancer Maternal Uncle    Breast cancer Maternal Grandmother    Sleep apnea Nephew    Thyroid  cancer Nephew    Sleep apnea Niece    Social History   Socioeconomic History   Marital status: Single    Spouse name: Not on file   Number of children: 1   Years of education: Not on file   Highest education level: 12th grade  Occupational History   Not on file  Tobacco Use    Smoking status: Never   Smokeless tobacco: Never  Vaping Use   Vaping status: Never Used  Substance and Sexual Activity   Alcohol use: No   Drug use: No   Sexual activity: Yes    Birth control/protection: Surgical  Other Topics Concern   Not on file  Social History Narrative   Not on file   Social Drivers of Health   Financial Resource Strain: Low Risk  (10/12/2023)   Overall Financial Resource Strain (CARDIA)    Difficulty of Paying Living Expenses: Not hard at all  Food Insecurity: No Food Insecurity (10/12/2023)   Hunger Vital Sign    Worried About Running Out of Food in the Last Year: Never true    Ran Out of Food in the Last Year: Never true  Transportation Needs: No Transportation Needs (10/12/2023)   PRAPARE - Administrator, Civil Service (Medical): No    Lack of Transportation (Non-Medical): No  Physical Activity: Inactive (10/12/2023)   Exercise Vital Sign    Days of Exercise per Week: 0 days    Minutes of Exercise per Session: 30 min  Stress: No Stress Concern Present (10/12/2023)   [employer] of Occupational Health - Occupational Stress Questionnaire    Feeling of Stress : Not at all  Recent Concern: Stress - Stress Concern Present (08/16/2023)   [employer] of Occupational Health - Occupational Stress Questionnaire    Feeling of Stress : To some extent  Social Connections: Socially Isolated (10/12/2023)   Social Connection and Isolation Panel [NHANES]    Frequency of Communication with Friends and Family: More than three times a week    Frequency of Social Gatherings with Friends and Family: Once a week    Attends Religious Services: Never    Database administrator or Organizations: No    Attends Engineer, structural: Not on file    Marital Status: Never married    Tobacco Counseling Counseling given: Yes    Clinical Intake:  Pre-visit preparation completed: Yes  Pain : No/denies pain     BMI - recorded:  46.83 Nutritional Status: BMI > 30  Obese Nutritional Risks: None Diabetes: No  Lab Results  Component Value Date   HGBA1C 5.6 02/05/2023     How often do you need to have someone help you when you read instructions, pamphlets, or other written materials from your doctor or pharmacy?: 1 - Never  Interpreter Needed?: No  Information entered by :: Alia t/cma   Activities of Daily Living     03/06/2024   11:35 AM  In your present state of health, do you have any difficulty performing the following activities:  Hearing? 0  Vision? 0  Difficulty concentrating or making decisions? 0  Walking or climbing stairs? 0  Dressing or bathing? 0  Doing errands, shopping? 0  Preparing Food and eating ? N  Using the Toilet? N  In the past six months, have you accidently leaked urine? N  Do you have problems with loss of bowel control? N  Managing your Medications? N  Managing your Finances? N  Housekeeping or managing your Housekeeping? N    Patient Care Team: Galvin Jules, FNP as PCP - General (Family Medicine) Mallipeddi, Kennyth Pean, MD as PCP - Cardiology (Cardiology)  Indicate any recent Medical Services you may have received from other than [facility] providers in the past year (date may be approximate).     Assessment:    This is a routine wellness examination for [place].  Hearing/Vision screen Hearing Screening - Comments:: Pt denies hearing dif Vision Screening - Comments:: Pt denies vision/pt goes to [facility] in [place], [place]   Goals Addressed             This Visit's Progress    Exercise 3x per week (30 min per time)   On track      Depression Screen     03/10/2024    8:49 AM 10/16/2023   10:48 AM 08/20/2023   10:48 AM 04/01/2023    8:28 AM 02/05/2023    8:56 AM 09/27/2016    8:48 AM 09/13/2016    3:23 PM  PHQ 2/9 Scores  PHQ - 2 Score 0 1 1 1 2 5  0  PHQ- 9 Score 0 4 9 3 9 16      Fall Risk     03/06/2024   11:35 AM 10/16/2023   10:48 AM 04/01/2023    8:28 AM  03/07/2023   11:19 AM 03/03/2023    9:27 AM  Fall Risk   Falls in the past year? 0 0 0 0 0  Number falls in past yr: 0   0 0  Injury with Fall? 0   0 0  Risk for fall due to :  No Fall Risks  No Fall Risks   Follow up  Falls evaluation completed  Falls prevention discussed     MEDICARE RISK AT HOME:  Medicare Risk at Home Any stairs in or around the home?: (Patient-Rptd) No If so, are there any without handrails?: (Patient-Rptd) No Home free of loose throw rugs in walkways, pet beds, electrical cords, etc?: (Patient-Rptd) Yes Adequate lighting in your home to reduce risk of falls?: (Patient-Rptd) Yes Life alert?: (Patient-Rptd) No Use of a cane, walker or w/c?: (Patient-Rptd) No Grab bars in the bathroom?: (Patient-Rptd) Yes Shower chair or bench in shower?: (Patient-Rptd) Yes Elevated toilet seat or a handicapped toilet?: (Patient-Rptd) Yes  TIMED UP AND GO:  Was the test performed?  no  Cognitive Function: 6CIT completed        03/10/2024    8:50 AM 03/07/2023   11:21 AM  6CIT Screen  What Year? 0 points 0 points  What month? 0 points 0 points  What time? 0 points 0 points  Count back from 20 0 points 0 points  Months in reverse 0 points 0 points  Repeat phrase 2 points 0 points  Total Score 2 points 0 points    Immunizations Immunization History  Administered Date(s) Administered   Moderna Sars-Covid-2 Vaccination 07/18/2020, 08/14/2020, 02/19/2021   Tdap 05/19/2012, 03/01/2024    Screening Tests Health Maintenance  Topic Date Due   HIV Screening  Never done   Hepatitis C Screening  Never done   Zoster Vaccines- Shingrix (1 of 2) Never done   Cervical Cancer Screening (HPV/Pap Cotest)  Never done   COVID-19  Vaccine (4 - 2024-25 season) 03/26/2025 (Originally 06/15/2023)   INFLUENZA VACCINE  05/14/2024   MAMMOGRAM  03/04/2025   Medicare Annual Wellness (AWV)  03/10/2025   Colonoscopy  11/13/2028   DTaP/Tdap/Td (3 - Td or Tdap) 03/01/2034   HPV VACCINES   Aged Out   Meningococcal B Vaccine  Aged Out    Health Maintenance  Health Maintenance Due  Topic Date Due   HIV Screening  Never done   Hepatitis C Screening  Never done   Zoster Vaccines- Shingrix (1 of 2) Never done   Cervical Cancer Screening (HPV/Pap Cotest)  Never done   Health Maintenance Items Addressed: See Nurse Notes  Additional Screening:  Vision Screening: Recommended annual ophthalmology exams for early detection of glaucoma and other disorders of the eye.  Dental Screening: Recommended annual dental exams for proper oral hygiene  Community Resource Referral / Chronic Care Management: CRR required this visit?  No   CCM required this visit?  No   Plan:    I have personally reviewed and noted the following in the patient's chart:   Medical and social history Use of alcohol, tobacco or illicit drugs  Current medications and supplements including opioid prescriptions. Patient is not currently taking opioid prescriptions. Functional ability and status Nutritional status Physical activity Advanced directives List of other physicians Hospitalizations, surgeries, and ER visits in previous 12 months Vitals Screenings to include cognitive, depression, and falls Referrals and appointments  In addition, I have reviewed and discussed with patient certain preventive protocols, quality metrics, and best practice recommendations. A written personalized care plan for preventive services as well as general preventive health recommendations were provided to patient.   Michaelle Adolphus, CMA   03/10/2024   After Visit Summary: (MyChart) Due to this being a telephonic visit, the after visit summary with patients personalized plan was offered to patient via MyChart   Notes: Please remember to get the following done at your next visit with your provider: Shingles vaccines, Pap, HIV & Hep C screening done. Pt is aware and agreed

## 2024-04-02 DIAGNOSIS — Z1231 Encounter for screening mammogram for malignant neoplasm of breast: Secondary | ICD-10-CM | POA: Diagnosis not present

## 2024-04-28 ENCOUNTER — Encounter: Payer: Self-pay | Admitting: Family Medicine

## 2024-04-28 ENCOUNTER — Ambulatory Visit (INDEPENDENT_AMBULATORY_CARE_PROVIDER_SITE_OTHER): Payer: 59 | Admitting: Family Medicine

## 2024-04-28 ENCOUNTER — Other Ambulatory Visit (HOSPITAL_COMMUNITY)
Admission: RE | Admit: 2024-04-28 | Discharge: 2024-04-28 | Disposition: A | Discharge status: Home / Self Care | Source: Ambulatory Visit | Attending: Family Medicine | Admitting: Family Medicine

## 2024-04-28 VITALS — BP 136/83 | HR 108 | Temp 97.4°F | Ht 67.0 in | Wt 300.8 lb

## 2024-04-28 DIAGNOSIS — M25471 Effusion, right ankle: Secondary | ICD-10-CM

## 2024-04-28 DIAGNOSIS — M25475 Effusion, left foot: Secondary | ICD-10-CM | POA: Diagnosis not present

## 2024-04-28 DIAGNOSIS — Z01419 Encounter for gynecological examination (general) (routine) without abnormal findings: Secondary | ICD-10-CM | POA: Diagnosis present

## 2024-04-28 DIAGNOSIS — Z124 Encounter for screening for malignant neoplasm of cervix: Secondary | ICD-10-CM

## 2024-04-28 DIAGNOSIS — E559 Vitamin D deficiency, unspecified: Secondary | ICD-10-CM | POA: Diagnosis not present

## 2024-04-28 DIAGNOSIS — R739 Hyperglycemia, unspecified: Secondary | ICD-10-CM | POA: Diagnosis not present

## 2024-04-28 DIAGNOSIS — M25474 Effusion, right foot: Secondary | ICD-10-CM

## 2024-04-28 DIAGNOSIS — Z0000 Encounter for general adult medical examination without abnormal findings: Secondary | ICD-10-CM | POA: Diagnosis not present

## 2024-04-28 DIAGNOSIS — Z114 Encounter for screening for human immunodeficiency virus [HIV]: Secondary | ICD-10-CM

## 2024-04-28 DIAGNOSIS — Z1151 Encounter for screening for human papillomavirus (HPV): Secondary | ICD-10-CM | POA: Insufficient documentation

## 2024-04-28 DIAGNOSIS — F5081 Binge eating disorder, mild: Secondary | ICD-10-CM

## 2024-04-28 DIAGNOSIS — Z6841 Body Mass Index (BMI) 40.0 and over, adult: Secondary | ICD-10-CM

## 2024-04-28 DIAGNOSIS — E782 Mixed hyperlipidemia: Secondary | ICD-10-CM

## 2024-04-28 DIAGNOSIS — D500 Iron deficiency anemia secondary to blood loss (chronic): Secondary | ICD-10-CM

## 2024-04-28 DIAGNOSIS — M25472 Effusion, left ankle: Secondary | ICD-10-CM

## 2024-04-28 DIAGNOSIS — E0849 Diabetes mellitus due to underlying condition with other diabetic neurological complication: Secondary | ICD-10-CM | POA: Insufficient documentation

## 2024-04-28 DIAGNOSIS — Z1159 Encounter for screening for other viral diseases: Secondary | ICD-10-CM

## 2024-04-28 DIAGNOSIS — R222 Localized swelling, mass and lump, trunk: Secondary | ICD-10-CM

## 2024-04-28 DIAGNOSIS — I10 Essential (primary) hypertension: Secondary | ICD-10-CM | POA: Diagnosis not present

## 2024-04-28 LAB — LIPID PANEL

## 2024-04-28 MED ORDER — LEVOTHYROXINE SODIUM 75 MCG PO TABS: 75.0000 ug | ORAL_TABLET | Freq: Every day | ORAL | 1 refills | Status: DC

## 2024-04-28 MED ORDER — LISINOPRIL 20 MG PO TABS: 20.0000 mg | ORAL_TABLET | Freq: Every day | ORAL | 1 refills | Status: DC

## 2024-04-28 MED ORDER — TOPIRAMATE 25 MG PO TABS: 25.0000 mg | ORAL_TABLET | Freq: Every day | ORAL | 3 refills | Status: DC

## 2024-04-28 MED ORDER — SIMVASTATIN 20 MG PO TABS: 20.0000 mg | ORAL_TABLET | Freq: Every day | ORAL | 1 refills | Status: DC

## 2024-04-28 MED ORDER — FUROSEMIDE 20 MG PO TABS: 20.0000 mg | ORAL_TABLET | Freq: Every day | ORAL | 1 refills | Status: DC

## 2024-04-28 NOTE — Progress Notes (Signed)
 Complete physical exam  Patient: Kathy Norman   DOB: 09-27-1966   58 y.o. Female  MRN: [id number]  Subjective:    Chief Complaint  Patient presents with   Annual Exam    Kathy Norman is a 58 y.o. female who presents today for a complete physical exam. She reports consuming a general diet. The patient does not participate in regular exercise at present. She generally feels well. She reports sleeping well. She does have additional problems to discuss today.   Kathy Norman is a 58 year old female who presents for an annual physical exam.  She notes that her Pap smear is due. She has completed her mammogram but has not yet received the results. Her colonoscopy is not due until 2030, as it was completed earlier this year. She received a tetanus and pneumonia shot in May, and her shingles vaccine is pending as she has never had chicken pox.  She inquires about medication to help control her appetite, noting that she tends to eat more when she is thinking about things, using food as a coping mechanism. She is currently taking Celexa  and feels her anxiety and depression are well controlled, although she mentions the recent loss of her mother in April has been difficult.  She mentions a place in her stomach that sometimes protrudes but does not cause pain or affect her bowel movements. A surgeon previously examined it and recommended an x-ray, and told her it was not a hernia.  She reports sleeping six to seven hours per night without sleep aids, although she takes her depression medication at bedtime. She has sleep apnea and uses a CPAP machine, which she has been using for almost five years.  She is not currently working but stays active by walking up and down her apartment complex three to four times a day, which helps clear her mind.  She continues to take her thyroid  medication and lisinopril  for blood pressure, with no reported side effects such as cough, chest pain, or leg swelling.  She notes her nails are brittle, prompting a check of her thyroid  function. She does not take an iron supplement despite a history of anemia, and she takes a daily vitamin D  supplement. She experiences some knee pain, which she attributes to arthritis, and continues to take Lasix  daily.       Most recent fall risk assessment:    04/28/2024    8:12 AM  Fall Risk   Falls in the past year? 0  Risk for fall due to : No Fall Risks  Follow up Falls evaluation completed     Most recent depression screenings:    04/28/2024    8:12 AM 03/10/2024    8:49 AM  PHQ 2/9 Scores  PHQ - 2 Score 2 0  PHQ- 9 Score 7 0    Vision:Within last year and Dental: No current dental problems, has dentures   Patient Active Problem List   Diagnosis Date Noted   Mild binge-eating disorder 04/28/2024   Tubular adenoma of colon 12/02/2023   Elevated LFTs 10/16/2023   Chronic pain of both knees 10/16/2023   Morbid obesity with BMI of 50.0-59.9, adult (HCC) 02/05/2023   Mixed hyperlipidemia 02/05/2023   Vitamin D  deficiency 02/05/2023   OSA (obstructive sleep apnea) 04/06/2018   Major depressive disorder, single episode, unspecified 10/15/2017   Depression 09/27/2016   Insomnia 07/02/2010   Primary hypertension 11/16/2007   Anemia, iron deficiency 07/20/2007   Past Medical History:  Diagnosis Date  Allergy 06/2021   Sulfate   Depression    Hypertension    Thyroid  disease    Past Surgical History:  Procedure Laterality Date   CESAREAN SECTION     CHOLECYSTECTOMY  03/1999   GALLBLADDER SURGERY     Social History   Tobacco Use   Smoking status: Never   Smokeless tobacco: Never  Vaping Use   Vaping status: Never Used  Substance Use Topics   Alcohol use: No   Drug use: No   Social History   Socioeconomic History   Marital status: Single    Spouse name: Not on file   Number of children: 1   Years of education: Not on file   Highest education level: 12th grade  Occupational History    Not on file  Tobacco Use   Smoking status: Never   Smokeless tobacco: Never  Vaping Use   Vaping status: Never Used  Substance and Sexual Activity   Alcohol use: No   Drug use: No   Sexual activity: Yes    Birth control/protection: Surgical  Other Topics Concern   Not on file  Social History Narrative   Not on file   Social Drivers of Health   Financial Resource Strain: Low Risk  (04/24/2024)   Overall Financial Resource Strain (CARDIA)    Difficulty of Paying Living Expenses: Not hard at all  Food Insecurity: No Food Insecurity (04/24/2024)   Hunger Vital Sign    Worried About Running Out of Food in the Last Year: Never true    Ran Out of Food in the Last Year: Never true  Transportation Needs: No Transportation Needs (04/24/2024)   PRAPARE - Administrator, Civil Service (Medical): No    Lack of Transportation (Non-Medical): No  Physical Activity: Insufficiently Active (04/24/2024)   Exercise Vital Sign    Days of Exercise per Week: 1 day    Minutes of Exercise per Session: 10 min  Stress: No Stress Concern Present (04/24/2024)   [employer] of Occupational Health - Occupational Stress Questionnaire    Feeling of Stress: Not at all  Social Connections: Socially Isolated (04/24/2024)   Social Connection and Isolation Panel    Frequency of Communication with Friends and Family: More than three times a week    Frequency of Social Gatherings with Friends and Family: Once a week    Attends Religious Services: Never    Database administrator or Organizations: No    Attends Engineer, structural: Not on file    Marital Status: Never married  Intimate Partner Violence: Not At Risk (03/07/2023)   Humiliation, Afraid, Rape, and Kick questionnaire    Fear of Current or Ex-Partner: No    Emotionally Abused: No    Physically Abused: No    Sexually Abused: No   Family Status  Relation Name Status   Mother Sahian Kerney Alive   Father  Deceased   Sister 3  Alive   Brother 2 Alive   Son  Alive   Mat Aunt  Deceased   Mat Uncle  Deceased   MGM  Deceased   MGF  Deceased   PGM  Deceased   PGF  Deceased   Interior and spatial designer  Alive   Niece  Alive  No partnership data on file   Family History  Problem Relation Age of Onset   Heart disease Mother    COPD Mother    COPD Sister    Skin cancer Sister  Throat cancer Brother    Breast cancer Maternal Aunt    Throat cancer Maternal Uncle    Breast cancer Maternal Grandmother    Sleep apnea Nephew    Thyroid  cancer Nephew    Sleep apnea Niece    Allergies  Allergen Reactions   Sulfa Antibiotics Hives   Sulfamethoxazole-Trimethoprim Hives   Misc. Sulfonamide Containing Compounds Hives   Sertraline Rash   Sertraline Hcl Rash      Patient Care Team: Severa Rock HERO, FNP as PCP - General (Family Medicine) Mallipeddi, Diannah SQUIBB, MD as PCP - Cardiology (Cardiology)   Outpatient Medications Prior to Visit  Medication Sig   amitriptyline (ELAVIL) 50 MG tablet Take 50 mg by mouth daily.   aspirin EC 81 MG tablet Take 1 tablet by mouth daily.   Cholecalciferol 125 MCG (5000 UT) TABS Take 1 tablet by mouth daily.   citalopram  (CELEXA ) 20 MG tablet Take 1 tablet (20 mg total) by mouth daily. (Patient taking differently: Take 20 mg by mouth 2 (two) times daily.)   ondansetron  (ZOFRAN ) 4 MG tablet Take 1 tablet (4 mg total) by mouth every 8 (eight) hours as needed for nausea or vomiting.   [DISCONTINUED] furosemide  (LASIX ) 20 MG tablet Take 1 tablet by mouth once daily   [DISCONTINUED] levothyroxine  (SYNTHROID ) 75 MCG tablet Take 1 tablet (75 mcg total) by mouth daily.   [DISCONTINUED] lisinopril  (ZESTRIL ) 20 MG tablet Take 1 tablet (20 mg total) by mouth daily.   [DISCONTINUED] simvastatin  (ZOCOR ) 20 MG tablet Take 1 tablet (20 mg total) by mouth at bedtime.   [DISCONTINUED] PREDNISONE  PO Take by mouth.   No facility-administered medications prior to visit.    ROS per HPI     Objective:     BP  136/83   Pulse (!) 108   Temp (!) 97.4 F (36.3 C)   Ht 5' 7 (1.702 m)   Wt (!) 300 lb 12.8 oz (136.4 kg)   SpO2 98%   BMI 47.11 kg/m  BP Readings from Last 3 Encounters:  04/28/24 136/83  03/10/24 138/86  10/29/23 138/86   Wt Readings from Last 3 Encounters:  04/28/24 (!) 300 lb 12.8 oz (136.4 kg)  03/10/24 299 lb (135.6 kg)  10/29/23 299 lb 12.8 oz (136 kg)   SpO2 Readings from Last 3 Encounters:  04/28/24 98%  10/29/23 95%  10/16/23 98%      Physical Exam Vitals and nursing note reviewed. Exam conducted with a chaperone present.  Constitutional:      General: She is not in acute distress.    Appearance: Normal appearance. She is well-developed and well-groomed. She is morbidly obese. She is not ill-appearing, toxic-appearing or diaphoretic.  HENT:     Head: Normocephalic and atraumatic.     Jaw: There is normal jaw occlusion.     Right Ear: Hearing, tympanic membrane, ear canal and external ear normal.     Left Ear: Hearing, tympanic membrane, ear canal and external ear normal.     Nose: Nose normal.     Mouth/Throat:     Lips: Pink.     Mouth: Mucous membranes are moist.     Dentition: Has dentures.     Pharynx: Oropharynx is clear. Uvula midline.  Eyes:     General: Lids are normal.     Extraocular Movements: Extraocular movements intact.     Conjunctiva/sclera: Conjunctivae normal.     Pupils: Pupils are equal, round, and reactive to light.  Neck:  Thyroid : No thyroid  mass, thyromegaly or thyroid  tenderness.     Vascular: No carotid bruit or JVD.     Trachea: Trachea and phonation normal.  Cardiovascular:     Rate and Rhythm: Normal rate and regular rhythm.     Chest Wall: PMI is not displaced.     Pulses: Normal pulses.     Heart sounds: Normal heart sounds. No murmur heard.    No friction rub. No gallop.  Pulmonary:     Effort: Pulmonary effort is normal. No respiratory distress.     Breath sounds: Normal breath sounds. No wheezing.  Abdominal:      General: Bowel sounds are normal. There is no distension or abdominal bruit.     Palpations: Abdomen is soft. There is mass. There is no hepatomegaly or splenomegaly.     Tenderness: There is no abdominal tenderness. There is no right CVA tenderness or left CVA tenderness.     Hernia: No hernia is present. There is no hernia in the left inguinal area or right inguinal area.   Genitourinary:    General: Normal vulva.     Exam position: Lithotomy position.     Pubic Area: No rash or pubic lice.      Tanner stage (genital): 5.     Labia:        Right: No rash, tenderness, lesion or injury.        Left: No rash, tenderness, lesion or injury.      Urethra: No prolapse, urethral pain, urethral swelling or urethral lesion.     Vagina: Normal.     Cervix: Normal.     Uterus: Normal.   Musculoskeletal:        General: Normal range of motion.     Cervical back: Normal range of motion and neck supple.     Right lower leg: No edema.     Left lower leg: No edema.  Lymphadenopathy:     Cervical: No cervical adenopathy.     Lower Body: No right inguinal adenopathy. No left inguinal adenopathy.  Skin:    General: Skin is warm and dry.     Capillary Refill: Capillary refill takes less than 2 seconds.     Coloration: Skin is not cyanotic, jaundiced or pale.     Findings: No rash.  Neurological:     General: No focal deficit present.     Mental Status: She is alert and oriented to person, place, and time.     Sensory: Sensation is intact.     Motor: Motor function is intact.     Coordination: Coordination is intact.     Gait: Gait is intact.     Deep Tendon Reflexes: Reflexes are normal and symmetric.  Psychiatric:        Attention and Perception: Attention and perception normal.        Mood and Affect: Mood and affect normal.        Speech: Speech normal.        Behavior: Behavior normal. Behavior is cooperative.        Thought Content: Thought content normal.        Cognition and  Memory: Cognition and memory normal.        Judgment: Judgment normal.       Last CBC Lab Results  Component Value Date   WBC 7.5 04/28/2024   HGB 13.9 04/28/2024   HCT 42.0 04/28/2024   MCV 97 04/28/2024   MCH 32.1 04/28/2024  RDW 12.2 04/28/2024   PLT 237 04/28/2024   Last metabolic panel Lab Results  Component Value Date   GLUCOSE 139 (H) 04/28/2024   NA 139 04/28/2024   K 4.2 04/28/2024   CL 101 04/28/2024   CO2 21 04/28/2024   BUN 10 04/28/2024   CREATININE 1.07 (H) 04/28/2024   EGFR 61 04/28/2024   CALCIUM 9.8 04/28/2024   PROT 7.4 04/28/2024   ALBUMIN 4.2 04/28/2024   LABGLOB 3.2 04/28/2024   AGRATIO 1.4 02/05/2023   BILITOT 0.4 04/28/2024   ALKPHOS 88 04/28/2024   AST 39 04/28/2024   ALT 35 (H) 04/28/2024   Last lipids Lab Results  Component Value Date   CHOL 155 04/28/2024   HDL 50 04/28/2024   LDLCALC 87 04/28/2024   TRIG 96 04/28/2024   CHOLHDL 3.1 04/28/2024   Last hemoglobin A1c Lab Results  Component Value Date   HGBA1C 5.6 02/05/2023   Last thyroid  functions Lab Results  Component Value Date   TSH 2.460 04/28/2024   T4TOTAL 11.4 04/28/2024   Last vitamin D  Lab Results  Component Value Date   VD25OH 113.0 (H) 04/28/2024       Assessment & Plan:    Routine Health Maintenance and Physical Exam  Immunization History  Administered Date(s) Administered   Moderna Sars-Covid-2 Vaccination 07/18/2020, 08/14/2020, 02/19/2021   Tdap 05/19/2012, 03/01/2024    Health Maintenance  Topic Date Due   Hepatitis B Vaccines (1 of 3 - 19+ 3-dose series) Never done   Cervical Cancer Screening (HPV/Pap Cotest)  Never done   Zoster Vaccines- Shingrix (1 of 2) 07/29/2024 (Originally 05/31/1985)   COVID-19 Vaccine (4 - 2024-25 season) 03/26/2025 (Originally 06/15/2023)   INFLUENZA VACCINE  05/14/2024   MAMMOGRAM  03/04/2025   Medicare Annual Wellness (AWV)  03/10/2025   Colonoscopy  11/13/2028   DTaP/Tdap/Td (3 - Td or Tdap) 03/01/2034    Hepatitis C Screening  Completed   HIV Screening  Completed   HPV VACCINES  Aged Out   Meningococcal B Vaccine  Aged Out    Discussed health benefits of physical activity, and encouraged her to engage in regular exercise appropriate for her age and condition.  Problem List Items Addressed This Visit       Cardiovascular and Mediastinum   Primary hypertension (Chronic)   Relevant Medications   simvastatin  (ZOCOR ) 20 MG tablet   lisinopril  (ZESTRIL ) 20 MG tablet   furosemide  (LASIX ) 20 MG tablet   Other Relevant Orders   CMP14+EGFR (Completed)   Thyroid  Panel With TSH (Completed)   Anemia Profile B (Completed)     Other   Mixed hyperlipidemia (Chronic)   Relevant Medications   simvastatin  (ZOCOR ) 20 MG tablet   lisinopril  (ZESTRIL ) 20 MG tablet   furosemide  (LASIX ) 20 MG tablet   Other Relevant Orders   CMP14+EGFR (Completed)   Lipid panel (Completed)   Anemia Profile B (Completed)   Morbid obesity with BMI of 50.0-59.9, adult (HCC)   Anemia, iron deficiency   Relevant Orders   Anemia Profile B (Completed)   Vitamin D  deficiency   Relevant Orders   CMP14+EGFR (Completed)   Vitamin D , 25-hydroxy (Completed)   Mild binge-eating disorder   Relevant Medications   topiramate  (TOPAMAX ) 25 MG tablet   Other Relevant Orders   CMP14+EGFR (Completed)   Thyroid  Panel With TSH (Completed)   Other Visit Diagnoses       Annual physical exam    -  Primary   Relevant Orders   CMP14+EGFR (  Completed)   Lipid panel (Completed)   Hepatitis C antibody (Completed)   HIV antibody (with reflex) (Completed)   Hepatitis B surface antibody,qualitative (Completed)   Anemia Profile B (Completed)   Cytology - PAP([facility])     Need for hepatitis C screening test       Relevant Orders   Hepatitis C antibody (Completed)     Need for hepatitis B screening test       Relevant Orders   Hepatitis B surface antibody,qualitative (Completed)     Screening for HIV (human immunodeficiency  virus)       Relevant Orders   HIV antibody (with reflex) (Completed)     Screening for cervical cancer       Relevant Orders   Cytology - PAP([facility])     Bilateral swelling of feet and ankles       Relevant Medications   furosemide  (LASIX ) 20 MG tablet   Other Relevant Orders   CMP14+EGFR (Completed)   Anemia Profile B (Completed)     Abdominal wall mass       Relevant Orders   US  Abdomen Limited      Assessment and Plan    Abdominal Mass. Reports a soft abdominal mass that occasionally protrudes without pain. Previous surgical evaluation ruled out hernia, recommending imaging.- Order abdominal ultrasound. Binge Eating DisorderUses food as a coping mechanism, consistent with binge eating disorder. Currently on Celexa  for anxiety and depression, which is well-controlled despite recent bereavement. Topamax  (topiramate ) considered for appetite control, with plans to assess effectiveness in six weeks.- Prescribe Topamax  (topiramate )- Follow up in 6 weeks to assess effectivenessHypothyroidismOn thyroid  medication. Reports brittle nails, possibly indicating suboptimal thyroid  function. Will check thyroid  function tests for appropriate management.- Check thyroid  function testsHypertensionOn lisinopril  for hypertension. No cough, chest pain, leg swelling, or shortness of breath reported. Continues current medication regimen.- Continue lisinopril - Refill lisinopril  prescriptionSleep ApneaUses CPAP for sleep apnea, compliant for almost five years.AnemiaAnemia not currently treated with iron supplement. Will check iron levels to assess status.- Check iron levels- Check vitamin D  levelsOsteoarthritisReports knee pain likely due to osteoarthritis.General Health MaintenancePap smear due. Mammogram recently completed, awaiting results. Colonoscopy not due until 2030. Tetanus and pneumonia vaccinations updated in May. Shingles vaccine not indicated as she has never had chicken pox.- Perform Pap smear-  Await mammogram results- No shingles vaccine neededFollow-upRequires follow-up to assess new medication effectiveness and review test results.- Follow up in 6 weeks to assess Topamax  effectiveness- Check thyroid  function, iron levels, and vitamin D  levels        Return in about 6 weeks (around 06/09/2024) for topamax .     Rosaline Bruns, FNP

## 2024-04-29 ENCOUNTER — Ambulatory Visit: Payer: Self-pay | Admitting: Family Medicine

## 2024-04-29 DIAGNOSIS — R87618 Other abnormal cytological findings on specimens from cervix uteri: Secondary | ICD-10-CM

## 2024-04-29 LAB — ANEMIA PROFILE B
Basophils Absolute: 0 x10E3/uL (ref 0.0–0.2)
Basos: 0 %
EOS (ABSOLUTE): 0.2 x10E3/uL (ref 0.0–0.4)
Eos: 3 %
Ferritin: 262 ng/mL — AB (ref 15–150)
Folate: 7 ng/mL (ref >3.0)
Hematocrit: 42 % (ref 34.0–46.6)
Hemoglobin: 13.9 g/dL (ref 11.1–15.9)
Immature Grans (Abs): 0 x10E3/uL (ref 0.0–0.1)
Immature Granulocytes: 0 %
Iron Saturation: 27 (ref 15–55)
Iron: 92 ug/dL (ref 27–159)
Lymphocytes Absolute: 2.1 x10E3/uL (ref 0.7–3.1)
Lymphs: 28 %
MCH: 32.1 pg (ref 26.6–33.0)
MCHC: 33.1 g/dL (ref 31.5–35.7)
MCV: 97 fL (ref 79–97)
Monocytes Absolute: 0.6 x10E3/uL (ref 0.1–0.9)
Monocytes: 8 %
Neutrophils Absolute: 4.6 x10E3/uL (ref 1.4–7.0)
Neutrophils: 61 %
Platelets: 237 x10E3/uL (ref 150–450)
RBC: 4.33 x10E6/uL (ref 3.77–5.28)
RDW: 12.2 % (ref 11.7–15.4)
Retic Ct Pct: 2.6 % (ref 0.6–2.6)
Total Iron Binding Capacity: 338 ug/dL (ref 250–450)
UIBC: 246 ug/dL (ref 131–425)
Vitamin B-12: 466 pg/mL (ref 232–1245)
WBC: 7.5 x10E3/uL (ref 3.4–10.8)

## 2024-04-29 LAB — LIPID PANEL
Cholesterol, Total: 155 mg/dL (ref 100–199)
HDL: 50 mg/dL (ref >39)
LDL CALC COMMENT:: 3.1 ratio (ref 0.0–4.4)
LDL Chol Calc (NIH): 87 mg/dL (ref 0–99)
Triglycerides: 96 mg/dL (ref 0–149)
VLDL Cholesterol Cal: 18 mg/dL (ref 5–40)

## 2024-04-29 LAB — CMP14+EGFR
ALT: 35 IU/L — ABNORMAL HIGH (ref 0–32)
AST: 39 IU/L (ref 0–40)
Albumin: 4.2 g/dL (ref 3.8–4.9)
Alkaline Phosphatase: 88 IU/L (ref 44–121)
BUN/Creatinine Ratio: 9 (ref 9–23)
BUN: 10 mg/dL (ref 6–24)
Bilirubin Total: 0.4 mg/dL (ref 0.0–1.2)
CO2: 21 mmol/L (ref 20–29)
Calcium: 9.8 mg/dL (ref 8.7–10.2)
Chloride: 101 mmol/L (ref 96–106)
Creatinine, Ser: 1.07 mg/dL — ABNORMAL HIGH (ref 0.57–1.00)
Globulin, Total: 3.2 g/dL (ref 1.5–4.5)
Glucose: 139 mg/dL — ABNORMAL HIGH (ref 70–99)
Potassium: 4.2 mmol/L (ref 3.5–5.2)
Sodium: 139 mmol/L (ref 134–144)
Total Protein: 7.4 g/dL (ref 6.0–8.5)
eGFR: 61 mL/min/1.73 (ref >59)

## 2024-04-29 LAB — HEPATITIS B SURFACE ANTIBODY,QUALITATIVE: Hep B Surface Ab, Qual: REACTIVE

## 2024-04-29 LAB — THYROID PANEL WITH TSH
Free Thyroxine Index: 2.5 (ref 1.2–4.9)
T3 Uptake Ratio: 22 — AB (ref 24–39)
T4, Total: 11.4 ug/dL (ref 4.5–12.0)
TSH: 2.46 u[IU]/mL (ref 0.450–4.500)

## 2024-04-29 LAB — HEPATITIS C ANTIBODY: Hep C Virus Ab: NONREACTIVE

## 2024-04-29 LAB — VITAMIN D 25 HYDROXY (VIT D DEFICIENCY, FRACTURES): Vit D, 25-Hydroxy: 113 ng/mL — AB (ref 30.0–100.0)

## 2024-04-29 LAB — HIV ANTIBODY (ROUTINE TESTING W REFLEX): HIV Screen 4th Generation wRfx: NONREACTIVE

## 2024-04-29 NOTE — Telephone Encounter (Signed)
 A1C added

## 2024-04-30 ENCOUNTER — Ambulatory Visit

## 2024-04-30 LAB — SPECIMEN STATUS REPORT

## 2024-05-01 LAB — SPECIMEN STATUS REPORT

## 2024-05-01 LAB — HGB A1C W/O EAG: Hgb A1c MFr Bld: 5.9 % — ABNORMAL HIGH (ref 4.8–5.6)

## 2024-05-04 ENCOUNTER — Telehealth: Payer: Self-pay

## 2024-05-04 NOTE — Addendum Note (Signed)
 Addended by: OLENA HARLENE BROCKS on: 05/04/2024 04:35 PM   Modules accepted: Orders

## 2024-05-04 NOTE — Telephone Encounter (Signed)
 Copied from CRM #[id number]. Topic: General - Call Back - No Documentation >> May 04, 2024  2:37 PM Willma R wrote: Reason for CRM: Patient states missed a call and voicemail advised to callback but no notes. Please contact patient at [phone]

## 2024-05-04 NOTE — Telephone Encounter (Signed)
Refer to lab results.  

## 2024-05-06 ENCOUNTER — Ambulatory Visit (HOSPITAL_COMMUNITY)
Admission: RE | Admit: 2024-05-06 | Discharge: 2024-05-06 | Disposition: A | Discharge status: Home / Self Care | Source: Ambulatory Visit | Attending: Family Medicine | Admitting: Family Medicine

## 2024-05-06 DIAGNOSIS — R222 Localized swelling, mass and lump, trunk: Secondary | ICD-10-CM | POA: Diagnosis not present

## 2024-05-06 DIAGNOSIS — R1900 Intra-abdominal and pelvic swelling, mass and lump, unspecified site: Secondary | ICD-10-CM | POA: Diagnosis not present

## 2024-05-13 ENCOUNTER — Telehealth: Payer: Self-pay

## 2024-05-13 NOTE — Telephone Encounter (Signed)
 Copied from CRM [id number]. Topic: General - Other >> May 13, 2024 11:54 AM Zebedee SAUNDERS wrote: Reason for CRM: Pt called reigarding US  Abdomen Limited (Accession [id number]) (Order [id number]), pt wants to discuss details with nurse. Please call pt at [phone]

## 2024-05-13 NOTE — Telephone Encounter (Signed)
Refer to lab results.  

## 2024-05-14 LAB — CYTOLOGY - PAP
Adequacy: ABSENT
Comment: NEGATIVE
Comment: NEGATIVE
Comment: NEGATIVE
HPV 16: NEGATIVE
HPV 18 / 45: POSITIVE — AB
High risk HPV: POSITIVE — AB

## 2024-05-20 ENCOUNTER — Encounter (INDEPENDENT_AMBULATORY_CARE_PROVIDER_SITE_OTHER): Payer: Self-pay | Admitting: *Deleted

## 2024-05-27 ENCOUNTER — Telehealth: Payer: Self-pay | Admitting: Family Medicine

## 2024-05-27 DIAGNOSIS — R222 Localized swelling, mass and lump, trunk: Secondary | ICD-10-CM

## 2024-05-27 NOTE — Telephone Encounter (Signed)
 Copied from CRM [id number]. Topic: Referral - Request for Referral >> May 27, 2024  2:51 PM Kathy Norman wrote: Did the patient discuss referral with their provider in the last year? Yes (If No - schedule appointment) (If Yes - send message)  Appointment offered? No  Type of order/referral and detailed reason for visit: Would like CT scan  Preference of office, provider, location: Kathy Norman  If referral order, have you been seen by this specialty before? No (If Yes, this issue or another issue? When? Where?  Can we respond through MyChart? Yes

## 2024-05-27 NOTE — Telephone Encounter (Unsigned)
 Copied from CRM #[id number]. Topic: General - Other >> May 27, 2024  4:27 PM Santiya F wrote: Reason for CRM: Patient is calling in returning a call from [place]. Please follow up with patient.

## 2024-05-28 ENCOUNTER — Telehealth: Payer: Self-pay | Admitting: Family Medicine

## 2024-05-28 NOTE — Telephone Encounter (Signed)
 Patient had abdominal US  on 7/24 for soft abd mass and it was normal.  She would like a CT since place is still there.

## 2024-05-28 NOTE — Telephone Encounter (Signed)
 Refer to other phone call

## 2024-05-28 NOTE — Telephone Encounter (Signed)
 Refer to other phone note .

## 2024-05-28 NOTE — Telephone Encounter (Unsigned)
 Copied from CRM [id number]. Topic: General - Other >> May 28, 2024 11:02 AM Larissa RAMAN wrote: Reason for CRM: Patient returning missed call. Patient requesting a callback.

## 2024-05-28 NOTE — Telephone Encounter (Signed)
 Patient aware and verbalizes understanding.

## 2024-05-28 NOTE — Addendum Note (Signed)
 Addended by: SEVERA ROCK HERO on: 05/28/2024 01:53 PM   Modules accepted: Orders

## 2024-05-31 DIAGNOSIS — G4733 Obstructive sleep apnea (adult) (pediatric): Secondary | ICD-10-CM | POA: Diagnosis not present

## 2024-06-08 ENCOUNTER — Ambulatory Visit (INDEPENDENT_AMBULATORY_CARE_PROVIDER_SITE_OTHER): Admitting: Women's Health

## 2024-06-08 ENCOUNTER — Encounter: Payer: Self-pay | Admitting: Women's Health

## 2024-06-08 ENCOUNTER — Other Ambulatory Visit (HOSPITAL_COMMUNITY)
Admission: RE | Admit: 2024-06-08 | Discharge: 2024-06-08 | Disposition: A | Discharge status: Home / Self Care | Source: Ambulatory Visit | Attending: Women's Health | Admitting: Women's Health

## 2024-06-08 VITALS — BP 134/86 | HR 102 | Ht 67.0 in | Wt 294.0 lb

## 2024-06-08 DIAGNOSIS — R87619 Unspecified abnormal cytological findings in specimens from cervix uteri: Secondary | ICD-10-CM | POA: Insufficient documentation

## 2024-06-08 DIAGNOSIS — R87612 Low grade squamous intraepithelial lesion on cytologic smear of cervix (LGSIL): Secondary | ICD-10-CM

## 2024-06-08 NOTE — Patient Instructions (Addendum)
 Colposcopy, Care After  The following information offers guidance on how to care for yourself after your procedure. Your health care provider may also give you more specific instructions. If you have problems or questions, contact your health care provider. What can I expect after the procedure? If you had a colposcopy without a biopsy, you can expect to feel fine right away after your procedure. However, you may have some spotting of blood for a few days. You can return to your normal activities. If you had a colposcopy with a biopsy, it is common after the procedure to have: Soreness and mild pain. These may last for a few days. Mild vaginal bleeding or discharge that is dark-colored and grainy. This may last for a few days. The discharge may be caused by a liquid (solution) that was used during the procedure. You may need to wear a sanitary pad during this time. Spotting of blood for at least 48 hours after the procedure. Follow these instructions at home: Medicines Take over-the-counter and prescription medicines only as told by your health care provider. Talk with your health care provider about what type of over-the-counter pain medicines and prescription medicines you can start to take again. It is especially important to talk with your health care provider if you take blood thinners. Activity Avoid using douche products, using tampons, and having sex for at least 3 days after the procedure or for as long as told by your health care provider. Return to your normal activities as told by your health care provider. Ask your health care provider what activities are safe for you. General instructions Ask your health care provider if you may take baths, swim, or use a hot tub. You may take showers. If you use birth control (contraception), continue to use it. Keep all follow-up visits. This is important. Contact a health care provider if: You have a fever or chills. You faint or feel  light-headed. Get help right away if: You have heavy bleeding from your vagina or pass blood clots. Heavy bleeding is bleeding that soaks through a sanitary pad in less than 1 hour. You have vaginal discharge that is abnormal, is yellow in color, or smells bad. This could be a sign of infection. You have severe pain or cramps in your lower abdomen that do not go away with medicine. Summary If you had a colposcopy without a biopsy, you can expect to feel fine right away, but you may have some spotting of blood for a few days. You can return to your normal activities. If you had a colposcopy with a biopsy, it is common to have mild pain for a few days and spotting for 48 hours after the procedure. Avoid using douche products, using tampons, and having sex for at least 3 days after the procedure or for as long as told by your health care provider. Get help right away if you have heavy bleeding, severe pain, or signs of infection. This information is not intended to replace advice given to you by your health care provider. Make sure you discuss any questions you have with your health care provider. Document Revised: 02/25/2021 Document Reviewed: 02/25/2021 Elsevier Patient Education  2024 [employer].

## 2024-06-08 NOTE — Progress Notes (Signed)
   COLPOSCOPY PROCEDURE NOTE Patient name: Kathy Norman MRN [id number]  Date of birth: [birth year] Subjective Findings:   Kathy Norman is a 58 y.o. G53P1001 Caucasian female being seen today for a colposcopy. Indication: Abnormal pap on 04/28/24: LSIL w/ HRHPV positive: 18/45 w/ PCP Reports never had abnormal pap prior to this  Prior cytology:  Date Result Procedure  ~82yrs ago Neg per pt None  No LMP recorded. Patient is postmenopausal. Contraception: post menopausal status. Menopausal: yes. Hysterectomy: no.   Considering pregnancy: No New sex partner: no Smoker: no. Immunocompromised: no.   The risks and benefits were explained and informed consent was obtained, and written copy is in chart. Pertinent History Reviewed:   Reviewed past medical,surgical, social, obstetrical and family history.  Reviewed problem list, medications and allergies. Objective Findings & Procedure:   Vitals:   06/08/24 0849 06/08/24 0856  BP: (!) 156/95 134/86  Pulse: (!) 120 (!) 102  Weight: 294 lb (133.4 kg)   Height: 5' 7 (1.702 m)   Body mass index is 46.05 kg/m.  No results found for this or any previous visit (from the past 24 hours).   Time out was performed.  Speculum placed in the vagina, cervix fully visualized. SCJ: not fully visualized. Cervix swabbed x 3 with acetic acid.  Acetowhitening present: No Cervix: no visible lesions, no mosaicism, no punctation, and no abnormal vasculature. Endocervical curettage performed, Cervical biopsies taken at 12 o'clock, and Hemostasis achieved with Monsel's solution. Vagina: vaginal colposcopy not performed Vulva: vulvar colposcopy not performed  Specimens: 2  Complications: none  Chaperone: Aleck Blase  Colposcopic Impression & Plan:   Normal colposcopy without lesions Plan: Post biopsy instructions given, Will notify patient of results when back, and Will base plan of care on pathology results and ASCCP guidelines  Return in about 1  year (around 06/08/2025) for Pap & physical; get last pap from [facility] (~63yrs ago) please.  Suzen JONELLE Fetters CNM, [facility] 06/08/2024 9:37 AM

## 2024-06-09 ENCOUNTER — Encounter: Payer: Self-pay | Admitting: Family Medicine

## 2024-06-09 ENCOUNTER — Ambulatory Visit (INDEPENDENT_AMBULATORY_CARE_PROVIDER_SITE_OTHER): Admitting: Family Medicine

## 2024-06-09 VITALS — BP 139/79 | HR 104 | Temp 97.2°F | Ht 67.0 in | Wt 295.0 lb

## 2024-06-09 DIAGNOSIS — F5081 Binge eating disorder, mild: Secondary | ICD-10-CM

## 2024-06-09 MED ORDER — TOPIRAMATE 50 MG PO TABS: 50.0000 mg | ORAL_TABLET | Freq: Two times a day (BID) | ORAL | 2 refills | Status: DC

## 2024-06-09 NOTE — Progress Notes (Signed)
 Subjective:  Patient ID: Kathy Norman, female    DOB: [birth year], 58 y.o.   MRN: [id number]  Patient Care Team: Severa Rock HERO, FNP as PCP - General (Family Medicine) Mallipeddi, Diannah SQUIBB, MD as PCP - Cardiology (Cardiology)   Chief Complaint:  med follow up  (6 week follow up - Topamax  )   HPI: Kathy Norman is a 58 y.o. female presenting on 06/09/2024 for med follow up  (6 week follow up - Topamax  )   Kathy Norman is a 58 year old female who presents for follow-up regarding her Topamax  treatment.  Since starting Topamax , she has lost five to six pounds and feels that it is helping her condition. She is currently taking 25 mg daily and believes an increase in dosage may be beneficial.  No side effects such as brain fog, dizziness, or drowsiness, which are common with Topamax . She notes a decrease in her food intake.  Her current medication regimen includes Topamax  25 mg daily.          Relevant past medical, surgical, family, and social history reviewed and updated as indicated.  Allergies and medications reviewed and updated. Data reviewed: Chart in Epic.   Past Medical History:  Diagnosis Date   Allergy 06/2021   Sulfate   Depression    Hypertension    Thyroid  disease     Past Surgical History:  Procedure Laterality Date   CESAREAN SECTION     CHOLECYSTECTOMY  03/1999   GALLBLADDER SURGERY      Social History   Socioeconomic History   Marital status: Single    Spouse name: Not on file   Number of children: 1   Years of education: Not on file   Highest education level: 12th grade  Occupational History   Not on file  Tobacco Use   Smoking status: Never   Smokeless tobacco: Never  Vaping Use   Vaping status: Never Used  Substance and Sexual Activity   Alcohol use: No   Drug use: No   Sexual activity: Yes    Birth control/protection: Surgical  Other Topics Concern   Not on file  Social History Narrative   Not on file   Social Drivers of  Health   Financial Resource Strain: Low Risk  (04/24/2024)   Overall Financial Resource Strain (CARDIA)    Difficulty of Paying Living Expenses: Not hard at all  Food Insecurity: No Food Insecurity (04/24/2024)   Hunger Vital Sign    Worried About Running Out of Food in the Last Year: Never true    Ran Out of Food in the Last Year: Never true  Transportation Needs: No Transportation Needs (04/24/2024)   PRAPARE - Administrator, Civil Service (Medical): No    Lack of Transportation (Non-Medical): No  Physical Activity: Insufficiently Active (04/24/2024)   Exercise Vital Sign    Days of Exercise per Week: 1 day    Minutes of Exercise per Session: 10 min  Stress: No Stress Concern Present (04/24/2024)   [employer] of Occupational Health - Occupational Stress Questionnaire    Feeling of Stress: Not at all  Social Connections: Socially Isolated (04/24/2024)   Social Connection and Isolation Panel    Frequency of Communication with Friends and Family: More than three times a week    Frequency of Social Gatherings with Friends and Family: Once a week    Attends Religious Services: Never    Database administrator or Organizations: No  Attends Banker Meetings: Not on file    Marital Status: Never married  Intimate Partner Violence: Not At Risk (03/07/2023)   Humiliation, Afraid, Rape, and Kick questionnaire    Fear of Current or Ex-Partner: No    Emotionally Abused: No    Physically Abused: No    Sexually Abused: No    Outpatient Encounter Medications as of 06/09/2024  Medication Sig   amitriptyline (ELAVIL) 50 MG tablet Take 50 mg by mouth daily.   aspirin EC 81 MG tablet Take 1 tablet by mouth daily.   Cholecalciferol 125 MCG (5000 UT) TABS Take 1 tablet by mouth daily.   citalopram  (CELEXA ) 20 MG tablet Take 1 tablet (20 mg total) by mouth daily.   furosemide  (LASIX ) 20 MG tablet Take 1 tablet (20 mg total) by mouth daily.   levothyroxine  (SYNTHROID ) 75  MCG tablet Take 1 tablet (75 mcg total) by mouth daily.   lisinopril  (ZESTRIL ) 20 MG tablet Take 1 tablet (20 mg total) by mouth daily.   ondansetron  (ZOFRAN ) 4 MG tablet Take 1 tablet (4 mg total) by mouth every 8 (eight) hours as needed for nausea or vomiting.   simvastatin  (ZOCOR ) 20 MG tablet Take 1 tablet (20 mg total) by mouth at bedtime.   topiramate  (TOPAMAX ) 50 MG tablet Take 1 tablet (50 mg total) by mouth 2 (two) times daily.   [DISCONTINUED] topiramate  (TOPAMAX ) 25 MG tablet Take 1 tablet (25 mg total) by mouth daily.   No facility-administered encounter medications on file as of 06/09/2024.    Allergies  Allergen Reactions   Sulfa Antibiotics Hives   Sulfamethoxazole-Trimethoprim Hives   Misc. Sulfonamide Containing Compounds Hives   Sertraline Rash   Sertraline Hcl Rash    Pertinent ROS per HPI, otherwise unremarkable      Objective:  BP 139/79   Pulse (!) 104   Temp (!) 97.2 F (36.2 C)   Ht 5' 7 (1.702 m)   Wt 295 lb (133.8 kg)   SpO2 96%   BMI 46.20 kg/m    Wt Readings from Last 3 Encounters:  06/09/24 295 lb (133.8 kg)  06/08/24 294 lb (133.4 kg)  04/28/24 (!) 300 lb 12.8 oz (136.4 kg)    Physical Exam Vitals and nursing note reviewed.  Constitutional:      General: She is not in acute distress.    Appearance: Normal appearance. She is well-developed and well-groomed. She is morbidly obese. She is not ill-appearing, toxic-appearing or diaphoretic.  HENT:     Head: Normocephalic and atraumatic.     Mouth/Throat:     Mouth: Mucous membranes are moist.  Eyes:     Pupils: Pupils are equal, round, and reactive to light.  Cardiovascular:     Rate and Rhythm: Normal rate and regular rhythm.  Pulmonary:     Effort: Pulmonary effort is normal.  Musculoskeletal:     Right lower leg: No edema.     Left lower leg: No edema.  Skin:    General: Skin is warm and dry.     Capillary Refill: Capillary refill takes less than 2 seconds.  Neurological:      General: No focal deficit present.     Mental Status: She is alert and oriented to person, place, and time.  Psychiatric:        Mood and Affect: Mood normal.        Behavior: Behavior normal. Behavior is cooperative.        Thought Content: Thought content  normal.        Judgment: Judgment normal.     Results for orders placed or performed in visit on 04/28/24  Cytology - PAP([facility])   Collection Time: 04/28/24  8:20 AM  Result Value Ref Range   High risk HPV Positive (A)    HPV 16 Negative    HPV 18 / 45 Positive (A)    Adequacy      Satisfactory for evaluation; transformation zone component ABSENT.   Diagnosis - Low grade squamous intraepithelial lesion (LSIL) (A)    Comment      There are a few cells suggestive of a higher grade lesion. Clinical   Comment correlation is recommended.    Comment Normal Reference Range HPV - Negative    Comment Normal Reference Range HPV 16- Negative    Comment Normal Reference Range HPV 16 18 45 -Negative   CMP14+EGFR   Collection Time: 04/28/24  8:28 AM  Result Value Ref Range   Glucose 139 (H) 70 - 99 mg/dL   BUN 10 6 - 24 mg/dL   Creatinine, Ser 8.92 (H) 0.57 - 1.00 mg/dL   eGFR 61 >40 fO/fpw/8.26   BUN/Creatinine Ratio 9 9 - 23   Sodium 139 134 - 144 mmol/L   Potassium 4.2 3.5 - 5.2 mmol/L   Chloride 101 96 - 106 mmol/L   CO2 21 20 - 29 mmol/L   Calcium 9.8 8.7 - 10.2 mg/dL   Total Protein 7.4 6.0 - 8.5 g/dL   Albumin 4.2 3.8 - 4.9 g/dL   Globulin, Total 3.2 1.5 - 4.5 g/dL   Bilirubin Total 0.4 0.0 - 1.2 mg/dL   Alkaline Phosphatase 88 44 - 121 IU/L   AST 39 0 - 40 IU/L   ALT 35 (H) 0 - 32 IU/L  Lipid panel   Collection Time: 04/28/24  8:28 AM  Result Value Ref Range   Cholesterol, Total 155 100 - 199 mg/dL   Triglycerides 96 0 - 149 mg/dL   HDL 50 >60 mg/dL   VLDL Cholesterol Cal 18 5 - 40 mg/dL   LDL Chol Calc (NIH) 87 0 - 99 mg/dL   Chol/HDL Ratio 3.1 0.0 - 4.4 ratio  Thyroid  Panel With TSH   Collection Time:  04/28/24  8:28 AM  Result Value Ref Range   TSH 2.460 0.450 - 4.500 uIU/mL   T4, Total 11.4 4.5 - 12.0 ug/dL   T3 Uptake Ratio 22 (L) 24 - 39 %   Free Thyroxine Index 2.5 1.2 - 4.9  Vitamin D , 25-hydroxy   Collection Time: 04/28/24  8:28 AM  Result Value Ref Range   Vit D, 25-Hydroxy 113.0 (H) 30.0 - 100.0 ng/mL  Hepatitis C antibody   Collection Time: 04/28/24  8:28 AM  Result Value Ref Range   Hep C Virus Ab Non Reactive Non Reactive  HIV antibody (with reflex)   Collection Time: 04/28/24  8:28 AM  Result Value Ref Range   HIV Screen 4th Generation wRfx Non Reactive Non Reactive  Hepatitis B surface antibody,qualitative   Collection Time: 04/28/24  8:28 AM  Result Value Ref Range   Hep B Surface Ab, Qual Reactive   Anemia Profile B   Collection Time: 04/28/24  8:28 AM  Result Value Ref Range   Total Iron Binding Capacity 338 250 - 450 ug/dL   UIBC 753 868 - 574 ug/dL   Iron 92 27 - 840 ug/dL   Iron Saturation 27 15 - 55 %  Ferritin 262 (H) 15 - 150 ng/mL   Vitamin B-12 466 232 - 1,245 pg/mL   Folate 7.0 >3.0 ng/mL   WBC 7.5 3.4 - 10.8 x10E3/uL   RBC 4.33 3.77 - 5.28 x10E6/uL   Hemoglobin 13.9 11.1 - 15.9 g/dL   Hematocrit 57.9 65.9 - 46.6 %   MCV 97 79 - 97 fL   MCH 32.1 26.6 - 33.0 pg   MCHC 33.1 31.5 - 35.7 g/dL   RDW 87.7 88.2 - 84.5 %   Platelets 237 150 - 450 x10E3/uL   Neutrophils 61 Not Estab. %   Lymphs 28 Not Estab. %   Monocytes 8 Not Estab. %   Eos 3 Not Estab. %   Basos 0 Not Estab. %   Neutrophils Absolute 4.6 1.4 - 7.0 x10E3/uL   Lymphocytes Absolute 2.1 0.7 - 3.1 x10E3/uL   Monocytes Absolute 0.6 0.1 - 0.9 x10E3/uL   EOS (ABSOLUTE) 0.2 0.0 - 0.4 x10E3/uL   Basophils Absolute 0.0 0.0 - 0.2 x10E3/uL   Immature Granulocytes 0 Not Estab. %   Immature Grans (Abs) 0.0 0.0 - 0.1 x10E3/uL   Retic Ct Pct 2.6 0.6 - 2.6 %  Specimen status report   Collection Time: 04/28/24  8:28 AM  Result Value Ref Range   specimen status report Comment   Hgb A1c w/o  eAG   Collection Time: 04/28/24  8:28 AM  Result Value Ref Range   Hgb A1c MFr Bld 5.9 (H) 4.8 - 5.6 %  Specimen status report   Collection Time: 04/28/24  8:28 AM  Result Value Ref Range   specimen status report Comment        Pertinent labs & imaging results that were available during my care of the patient were reviewed by me and considered in my medical decision making.  Assessment & Plan:  Cayla was seen today for med follow up .  Diagnoses and all orders for this visit:  Mild binge-eating disorder -     topiramate  (TOPAMAX ) 50 MG tablet; Take 1 tablet (50 mg total) by mouth 2 (two) times daily.     Binge eating disorder Binge eating disorder is well-managed with Topamax , resulting in a weight loss of five to six pounds and reduced food intake. The medication is well-tolerated without adverse effects such as brain fog, dizziness, or drowsiness. Insurance coverage is confirmed. - Increase Topamax  to 50 mg once daily for two weeks. - If well-tolerated, increase Topamax  to 100 mg once daily. - Monitor for side effects such as brain fog, dizziness, or drowsiness. - Discuss potential side effects including brain fog, dizziness, and drowsiness. - Follow up in three months to assess labs, weight, and medication efficacy.          Continue all other maintenance medications.  Follow up plan: Return in 3 months (on 09/09/2024), or if symptoms worsen or fail to improve, for BMI.   Continue healthy lifestyle choices, including diet (rich in fruits, vegetables, and lean proteins, and low in salt and simple carbohydrates) and exercise (at least 30 minutes of moderate physical activity daily).   The above assessment and management plan was discussed with the patient. The patient verbalized understanding of and has agreed to the management plan. Patient is aware to call the clinic if they develop any new symptoms or if symptoms persist or worsen. Patient is aware when to return to  the clinic for a follow-up visit. Patient educated on when it is appropriate to go to the emergency  department.   Rosaline Bruns, FNP-C [facility] [phone]

## 2024-06-10 ENCOUNTER — Ambulatory Visit: Payer: Self-pay | Admitting: Women's Health

## 2024-06-10 LAB — SURGICAL PATHOLOGY

## 2024-06-15 ENCOUNTER — Ambulatory Visit (HOSPITAL_COMMUNITY)
Admission: RE | Admit: 2024-06-15 | Discharge: 2024-06-15 | Disposition: A | Discharge status: Home / Self Care | Source: Ambulatory Visit | Attending: Family Medicine | Admitting: Family Medicine

## 2024-06-15 DIAGNOSIS — I700 Atherosclerosis of aorta: Secondary | ICD-10-CM | POA: Diagnosis not present

## 2024-06-15 DIAGNOSIS — R590 Localized enlarged lymph nodes: Secondary | ICD-10-CM | POA: Diagnosis not present

## 2024-06-15 DIAGNOSIS — R222 Localized swelling, mass and lump, trunk: Secondary | ICD-10-CM | POA: Diagnosis present

## 2024-06-15 DIAGNOSIS — N200 Calculus of kidney: Secondary | ICD-10-CM | POA: Diagnosis not present

## 2024-06-15 DIAGNOSIS — R1900 Intra-abdominal and pelvic swelling, mass and lump, unspecified site: Secondary | ICD-10-CM | POA: Diagnosis not present

## 2024-06-20 ENCOUNTER — Ambulatory Visit: Payer: Self-pay | Admitting: Family Medicine

## 2024-09-14 ENCOUNTER — Ambulatory Visit: Payer: Self-pay | Admitting: Family Medicine

## 2024-09-14 ENCOUNTER — Encounter: Payer: Self-pay | Admitting: Family Medicine

## 2024-09-14 VITALS — BP 138/81 | HR 93 | Temp 96.8°F | Ht 67.0 in | Wt 289.2 lb

## 2024-09-14 DIAGNOSIS — E782 Mixed hyperlipidemia: Secondary | ICD-10-CM

## 2024-09-14 DIAGNOSIS — I10 Essential (primary) hypertension: Secondary | ICD-10-CM

## 2024-09-14 DIAGNOSIS — M25475 Effusion, left foot: Secondary | ICD-10-CM | POA: Insufficient documentation

## 2024-09-14 DIAGNOSIS — Z23 Encounter for immunization: Secondary | ICD-10-CM

## 2024-09-14 DIAGNOSIS — D500 Iron deficiency anemia secondary to blood loss (chronic): Secondary | ICD-10-CM

## 2024-09-14 DIAGNOSIS — E039 Hypothyroidism, unspecified: Secondary | ICD-10-CM | POA: Insufficient documentation

## 2024-09-14 DIAGNOSIS — Z6841 Body Mass Index (BMI) 40.0 and over, adult: Secondary | ICD-10-CM

## 2024-09-14 DIAGNOSIS — F320 Major depressive disorder, single episode, mild: Secondary | ICD-10-CM

## 2024-09-14 DIAGNOSIS — F5081 Binge eating disorder, mild: Secondary | ICD-10-CM

## 2024-09-14 DIAGNOSIS — E559 Vitamin D deficiency, unspecified: Secondary | ICD-10-CM

## 2024-09-14 DIAGNOSIS — M25474 Effusion, right foot: Secondary | ICD-10-CM

## 2024-09-14 LAB — BAYER DCA HB A1C WAIVED: HB A1C (BAYER DCA - WAIVED): 5.2 % (ref 4.8–5.6)

## 2024-09-14 MED ORDER — LOSARTAN POTASSIUM 50 MG PO TABS: 50.0000 mg | ORAL_TABLET | Freq: Every day | ORAL | 2 refills | Status: AC

## 2024-09-14 MED ORDER — LEVOTHYROXINE SODIUM 75 MCG PO TABS: 75.0000 ug | ORAL_TABLET | Freq: Every day | ORAL | 1 refills | Status: AC

## 2024-09-14 MED ORDER — FUROSEMIDE 20 MG PO TABS: 20.0000 mg | ORAL_TABLET | Freq: Every day | ORAL | 1 refills | Status: AC

## 2024-09-14 MED ORDER — SIMVASTATIN 20 MG PO TABS: 20.0000 mg | ORAL_TABLET | Freq: Every day | ORAL | 1 refills | Status: AC

## 2024-09-14 NOTE — Progress Notes (Signed)
 Subjective:  Patient ID: Kathy Norman, female    DOB: 18-Jul-1966, 58 y.o.   MRN: [id number]  Patient Care Team: Severa Rock HERO, FNP as PCP - General (Family Medicine) Mallipeddi, Diannah SQUIBB, MD as PCP - Cardiology (Cardiology)   Chief Complaint:  Medical Management of Chronic Issues and Cough (X 1 1/2 months )   HPI: Kathy Norman is a 58 y.o. female presenting on 09/14/2024 for Medical Management of Chronic Issues and Cough (X 1 1/2 months )    Kathy Norman is a 58 year old female with obesity and hypertension who presents for management of chronic medical conditions and follow-up on weight management and medication side effects.  She has been taking Topamax , and since increasing the dose to three tablets per day, she has experienced lightheadedness. Despite this, she has lost weight, going from 295 pounds to 289 pounds since her last visit. She has made dietary changes, including giving up sweets and sodas, and has been increasing her physical activity by walking up and down stairs five or six times a day.  She has a history of hypertension and is currently taking lisinopril  and Lasix . She reports a dry cough that has persisted for a couple of months, which is more pronounced at night and wakes her up more often. No chest pain, leg swelling, shortness of breath, or headaches.  She is also taking simvastatin  for cholesterol management and Synthroid  for thyroid  function, both without any reported issues. She is taking vitamin D  supplements and denies any issues with it. She does not take iron supplements despite a history of iron deficiency.  She reports feeling small cysts under her skin for about three weeks, which do not cause her discomfort. No issues with depression or anxiety and she reports sleeping well. No changes in bowel movements and denies any changes in vision, swelling in hands or feet, or issues with hair, skin, or nails.          Relevant past medical, surgical,  family, and social history reviewed and updated as indicated.  Allergies and medications reviewed and updated. Data reviewed: Chart in Epic.   Past Medical History:  Diagnosis Date   Allergy 06/2021   Sulfate   Depression    Hypertension    Thyroid  disease     Past Surgical History:  Procedure Laterality Date   CESAREAN SECTION     CHOLECYSTECTOMY  03/1999   GALLBLADDER SURGERY      Social History   Socioeconomic History   Marital status: Single    Spouse name: Not on file   Number of children: 1   Years of education: Not on file   Highest education level: 12th grade  Occupational History   Not on file  Tobacco Use   Smoking status: Never   Smokeless tobacco: Never  Vaping Use   Vaping status: Never Used  Substance and Sexual Activity   Alcohol use: No   Drug use: No   Sexual activity: Yes    Birth control/protection: Surgical  Other Topics Concern   Not on file  Social History Narrative   Not on file   Social Drivers of Health   Financial Resource Strain: Low Risk  (09/07/2024)   Overall Financial Resource Strain (CARDIA)    Difficulty of Paying Living Expenses: Not hard at all  Food Insecurity: No Food Insecurity (09/07/2024)   Hunger Vital Sign    Worried About Running Out of Food in the Last Year: Never true  Ran Out of Food in the Last Year: Never true  Transportation Needs: No Transportation Needs (09/07/2024)   PRAPARE - Administrator, Civil Service (Medical): No    Lack of Transportation (Non-Medical): No  Physical Activity: Insufficiently Active (09/07/2024)   Exercise Vital Sign    Days of Exercise per Week: 2 days    Minutes of Exercise per Session: 10 min  Stress: No Stress Concern Present (09/07/2024)   [employer] of Occupational Health - Occupational Stress Questionnaire    Feeling of Stress: Only a little  Social Connections: Socially Isolated (09/07/2024)   Social Connection and Isolation Panel    Frequency of  Communication with Friends and Family: More than three times a week    Frequency of Social Gatherings with Friends and Family: Once a week    Attends Religious Services: Never    Database Administrator or Organizations: No    Attends Engineer, Structural: Not on file    Marital Status: Never married  Intimate Partner Violence: Not At Risk (03/07/2023)   Humiliation, Afraid, Rape, and Kick questionnaire    Fear of Current or Ex-Partner: No    Emotionally Abused: No    Physically Abused: No    Sexually Abused: No    Outpatient Encounter Medications as of 09/14/2024  Medication Sig   amitriptyline (ELAVIL) 50 MG tablet Take 50 mg by mouth daily.   aspirin EC 81 MG tablet Take 1 tablet by mouth daily.   Cholecalciferol 125 MCG (5000 UT) TABS Take 1 tablet by mouth daily.   citalopram  (CELEXA ) 20 MG tablet Take 1 tablet (20 mg total) by mouth daily.   losartan (COZAAR) 50 MG tablet Take 1 tablet (50 mg total) by mouth daily.   topiramate  (TOPAMAX ) 50 MG tablet Take 1 tablet (50 mg total) by mouth 2 (two) times daily.   [DISCONTINUED] lisinopril  (ZESTRIL ) 20 MG tablet Take 1 tablet (20 mg total) by mouth daily.   furosemide  (LASIX ) 20 MG tablet Take 1 tablet (20 mg total) by mouth daily.   levothyroxine  (SYNTHROID ) 75 MCG tablet Take 1 tablet (75 mcg total) by mouth daily.   simvastatin  (ZOCOR ) 20 MG tablet Take 1 tablet (20 mg total) by mouth at bedtime.   [DISCONTINUED] furosemide  (LASIX ) 20 MG tablet Take 1 tablet (20 mg total) by mouth daily.   [DISCONTINUED] levothyroxine  (SYNTHROID ) 75 MCG tablet Take 1 tablet (75 mcg total) by mouth daily.   [DISCONTINUED] ondansetron  (ZOFRAN ) 4 MG tablet Take 1 tablet (4 mg total) by mouth every 8 (eight) hours as needed for nausea or vomiting.   [DISCONTINUED] simvastatin  (ZOCOR ) 20 MG tablet Take 1 tablet (20 mg total) by mouth at bedtime.   No facility-administered encounter medications on file as of 09/14/2024.    Allergies  Allergen  Reactions   Sulfa Antibiotics Hives   Sulfamethoxazole-Trimethoprim Hives   Misc. Sulfonamide Containing Compounds Hives   Sertraline Rash   Sertraline Hcl Rash    Pertinent ROS per HPI, otherwise unremarkable      Objective:  BP 138/81   Pulse 93   Temp (!) 96.8 F (36 C)   Ht 5' 7 (1.702 m)   Wt 289 lb 3.2 oz (131.2 kg)   SpO2 97%   BMI 45.30 kg/m    Wt Readings from Last 3 Encounters:  09/14/24 289 lb 3.2 oz (131.2 kg)  06/09/24 295 lb (133.8 kg)  06/08/24 294 lb (133.4 kg)    Physical Exam Vitals and  nursing note reviewed.  Constitutional:      General: She is not in acute distress.    Appearance: Normal appearance. She is well-developed and well-groomed. She is morbidly obese. She is not ill-appearing, toxic-appearing or diaphoretic.  HENT:     Head: Normocephalic and atraumatic.     Jaw: There is normal jaw occlusion.     Right Ear: Hearing normal.     Left Ear: Hearing normal.     Nose: Nose normal.     Mouth/Throat:     Lips: Pink.     Mouth: Mucous membranes are moist.     Dentition: Has dentures.     Pharynx: Oropharynx is clear. Uvula midline.  Eyes:     General: Lids are normal.     Extraocular Movements: Extraocular movements intact.     Conjunctiva/sclera: Conjunctivae normal.     Pupils: Pupils are equal, round, and reactive to light.  Neck:     Thyroid : No thyroid  mass, thyromegaly or thyroid  tenderness.     Vascular: No carotid bruit or JVD.     Trachea: Trachea and phonation normal.  Cardiovascular:     Rate and Rhythm: Normal rate and regular rhythm.     Chest Wall: PMI is not displaced.     Pulses: Normal pulses.     Heart sounds: Normal heart sounds. No murmur heard.    No friction rub. No gallop.  Pulmonary:     Effort: Pulmonary effort is normal. No respiratory distress.     Breath sounds: Normal breath sounds. No wheezing.  Abdominal:     General: Bowel sounds are normal. There is no distension or abdominal bruit.      Palpations: Abdomen is soft. There is no hepatomegaly or splenomegaly.     Tenderness: There is no abdominal tenderness. There is no right CVA tenderness or left CVA tenderness.     Hernia: No hernia is present.  Musculoskeletal:        General: Normal range of motion.     Cervical back: Normal range of motion and neck supple.     Right lower leg: No edema.     Left lower leg: No edema.  Lymphadenopathy:     Cervical: No cervical adenopathy.  Skin:    General: Skin is warm and dry.     Capillary Refill: Capillary refill takes less than 2 seconds.     Coloration: Skin is not cyanotic, jaundiced or pale.     Findings: No rash.  Neurological:     General: No focal deficit present.     Mental Status: She is alert and oriented to person, place, and time.     Sensory: Sensation is intact.     Motor: Motor function is intact.     Coordination: Coordination is intact.     Gait: Gait is intact.     Deep Tendon Reflexes: Reflexes are normal and symmetric.  Psychiatric:        Attention and Perception: Attention and perception normal.        Mood and Affect: Mood and affect normal.        Speech: Speech normal.        Behavior: Behavior normal. Behavior is cooperative.        Thought Content: Thought content normal.        Cognition and Memory: Cognition and memory normal.        Judgment: Judgment normal.     Results for orders placed or performed in visit  on 06/08/24  Surgical pathology   Collection Time: 06/08/24  9:38 AM  Result Value Ref Range   SURGICAL PATHOLOGY      SURGICAL PATHOLOGY CASE: [id number] PATIENT: Aulani Koepp Surgical Pathology Report     Clinical History: LGSIL (cm)     FINAL MICROSCOPIC DIAGNOSIS:  A. 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  B. 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      Endocervical glands without significant diagnostic  alteration.      Negative for hyperplasia or malignancy.   GROSS DESCRIPTION:  A. Received in formalin labeled with the patients name and 12 oclock is a 0.2 cm piece of tan soft tissue, submitted in toto in a single cassette.  B. Received in formalin labeled with the patients name and ECC is a 0.8 x 0.8 x 0.2 cm aggregate of tan soft tissue fragments engrossed in mucoid material, submitted in toto in a single cassette.  (LEF 06/09/2024)  Final Diagnosis performed by Pepper Dutton, MD.   Electronically signed 06/10/2024 Technical and / or Professional components  performed at [facility]. [facility], [address], [place], [place] [address].  Immunohistochemistry Technical component (if applicable) was performed at [facility]. [address], [place], [place] [address].   IMMUNOHISTOCHEMISTRY DISCLAIMER (if applicable): Some of these immunohistochemical stains may have been developed and the performance characteristics determine by [facility]. Some may not have been cleared or approved by the U.S. Food and Drug Administration. The FDA has determined that such clearance or approval is not necessary. This test is used for clinical purposes. It should not be regarded as investigational or for research. This laboratory is certified under the Clinical Laboratory Improvement Amendments of 1988 (CLIA-88) as qualified to perform high complexity clinical laboratory testing.  The controls stained appropriately.   IHC stains are performed on formalin fixed, paraffin embedded tissue using a  3,3diaminobenzidine (DAB) chromogen and Leica Bond Autostainer System. The staining intensity of the nucleus is score manually and is reported as the percentage of tumor cell nuclei demonstrating specific nuclear staining. The specimens are fixed in 10% Neutral Formalin for at least 6 hours and up to 72hrs. These tests are validated on decalcified  tissue. Results should be interpreted with caution given the possibility of false negative results on decalcified specimens. Antibody Clones are as follows ER-clone 44F, PR-clone 16, Ki67- clone MM1. Some of these immunohistochemical stains may have been developed and the performance characteristics determined by [facility].        Pertinent labs & imaging results that were available during my care of the patient were reviewed by me and considered in my medical decision making.  Assessment & Plan:  Kathy Norman was seen today for medical management of chronic issues and cough.  Diagnoses and all orders for this visit:  Adult BMI 45.0-49.9 kg/sq m (HCC) -     Anemia Profile B -     Vitamin D , 25-hydroxy -     CMP14+EGFR -     Bayer DCA Hb A1c Waived -     TSH -     T4, Free  Primary hypertension -     furosemide  (LASIX ) 20 MG tablet; Take 1 tablet (20 mg total) by mouth daily. -     Anemia Profile B -     CMP14+EGFR -     Bayer DCA Hb  A1c Waived -     losartan  (COZAAR ) 50 MG tablet; Take 1 tablet (50 mg total) by mouth daily. -     TSH -     T4, Free  Bilateral swelling of feet and ankles -     furosemide  (LASIX ) 20 MG tablet; Take 1 tablet (20 mg total) by mouth daily. -     CMP14+EGFR  Mixed hyperlipidemia -     simvastatin  (ZOCOR ) 20 MG tablet; Take 1 tablet (20 mg total) by mouth at bedtime. -     CMP14+EGFR  Mild binge-eating disorder -     Anemia Profile B -     Vitamin D , 25-hydroxy -     CMP14+EGFR -     Bayer DCA Hb A1c Waived -     TSH -     T4, Free  Vitamin D  deficiency -     Vitamin D , 25-hydroxy -     CMP14+EGFR  Iron deficiency anemia due to chronic blood loss -     Anemia Profile B  Current mild episode of major depressive disorder without prior episode -     CMP14+EGFR -     TSH -     T4, Free  Acquired hypothyroidism -     levothyroxine  (SYNTHROID ) 75 MCG tablet; Take 1 tablet (75 mcg total) by mouth daily. -     TSH -     T4,  Free    Flu vaccination given today.     Morbid obesity Weight loss of almost 10 pounds since last visit. Currently on Topamax  with side effect of lightheadedness. Engaging in lifestyle modifications including dietary changes and increased physical activity. - Adjust Topamax  to 1 tablet in the morning and 2 tablets at bedtime to manage lightheadedness. - Continue lifestyle modifications including dietary changes and increased physical activity.  Mild binge-eating disorder Engaging in dietary changes such as eliminating sweets and sodas.  Primary hypertension Currently on lisinopril  with a side effect of dry cough, likely due to lisinopril . - Switched lisinopril  to losartan  50 mg daily. - Monitor blood pressure to ensure losartan  is effective.  Mixed hyperlipidemia Currently on simvastatin  with no reported issues. - Continue simvastatin  as prescribed.  Acquired hypothyroidism Currently on Synthroid  75 mcg with no reported issues. - Continue Synthroid  75 mcg daily.  Iron deficiency anemia due to chronic blood loss Not currently taking iron supplements. - Ordered lab work to check iron levels.  Vitamin D  deficiency Currently taking vitamin D  supplements with no reported issues. - Continue vitamin D  supplementation.  Major depressive disorder, single episode, mild No reported issues with depression or anxiety. Sleeping well, possibly aided by Topamax . - Continue current management for depression.          Continue all other maintenance medications.  Follow up plan: Return in about 6 months (around 03/15/2025) for Annual Physical.   Continue healthy lifestyle choices, including diet (rich in fruits, vegetables, and lean proteins, and low in salt and simple carbohydrates) and exercise (at least 30 minutes of moderate physical activity daily).  Educational handout given for health maintenance   The above assessment and management plan was discussed with the patient. The  patient verbalized understanding of and has agreed to the management plan. Patient is aware to call the clinic if they develop any new symptoms or if symptoms persist or worsen. Patient is aware when to return to the clinic for a follow-up visit. Patient educated on when it is appropriate to go to the emergency department.  Kathy Bruns, FNP-C [facility] [phone]

## 2024-09-15 LAB — ANEMIA PROFILE B
Basophils Absolute: 0 x10E3/uL (ref 0.0–0.2)
Basos: 1 %
EOS (ABSOLUTE): 0.2 x10E3/uL (ref 0.0–0.4)
Eos: 3 %
Ferritin: 224 ng/mL — AB (ref 15–150)
Folate: 3 ng/mL — ABNORMAL LOW (ref >3.0)
Hematocrit: 39.3 % (ref 34.0–46.6)
Hemoglobin: 13.3 g/dL (ref 11.1–15.9)
Immature Grans (Abs): 0 x10E3/uL (ref 0.0–0.1)
Immature Granulocytes: 0 %
Iron Saturation: 35 % (ref 15–55)
Iron: 113 ug/dL (ref 27–159)
Lymphocytes Absolute: 2.1 x10E3/uL (ref 0.7–3.1)
Lymphs: 34 %
MCH: 32.7 pg (ref 26.6–33.0)
MCHC: 33.8 g/dL (ref 31.5–35.7)
MCV: 97 fL (ref 79–97)
Monocytes Absolute: 0.5 x10E3/uL (ref 0.1–0.9)
Monocytes: 8 %
Neutrophils Absolute: 3.3 x10E3/uL (ref 1.4–7.0)
Neutrophils: 54 %
Platelets: 218 x10E3/uL (ref 150–450)
RBC: 4.07 x10E6/uL (ref 3.77–5.28)
RDW: 12.1 % (ref 11.7–15.4)
Retic Ct Pct: 2.6 % (ref 0.6–2.6)
Total Iron Binding Capacity: 325 ug/dL (ref 250–450)
UIBC: 212 ug/dL (ref 131–425)
Vitamin B-12: 455 pg/mL (ref 232–1245)
WBC: 6 x10E3/uL (ref 3.4–10.8)

## 2024-09-15 LAB — CMP14+EGFR
ALT: 24 IU/L (ref 0–32)
AST: 35 IU/L (ref 0–40)
Albumin: 4.2 g/dL (ref 3.8–4.9)
Alkaline Phosphatase: 78 IU/L (ref 49–135)
BUN/Creatinine Ratio: 11 (ref 9–23)
BUN: 11 mg/dL (ref 6–24)
Bilirubin Total: 0.6 mg/dL (ref 0.0–1.2)
CO2: 19 mmol/L — ABNORMAL LOW (ref 20–29)
Calcium: 9.1 mg/dL (ref 8.7–10.2)
Chloride: 107 mmol/L — ABNORMAL HIGH (ref 96–106)
Creatinine, Ser: 1.01 mg/dL — ABNORMAL HIGH (ref 0.57–1.00)
Globulin, Total: 2.8 g/dL (ref 1.5–4.5)
Glucose: 105 mg/dL — ABNORMAL HIGH (ref 70–99)
Potassium: 3.6 mmol/L (ref 3.5–5.2)
Sodium: 142 mmol/L (ref 134–144)
Total Protein: 7 g/dL (ref 6.0–8.5)
eGFR: 65 mL/min/1.73 (ref >59)

## 2024-09-15 LAB — TSH: TSH: 2.93 u[IU]/mL (ref 0.450–4.500)

## 2024-09-15 LAB — VITAMIN D 25 HYDROXY (VIT D DEFICIENCY, FRACTURES): Vit D, 25-Hydroxy: 89.1 ng/mL (ref 30.0–100.0)

## 2024-09-15 LAB — T4, FREE: Free T4: 1.3 ng/dL (ref 0.82–1.77)

## 2024-09-27 NOTE — Progress Notes (Unsigned)
 PATIENT: Kathy Norman DOB: 24-Aug-1966  REASON FOR VISIT: follow up HISTORY FROM: patient  Virtual Visit via Telephone Note  I connected with Kathy Norman on 09/28/2024 at  8:30 AM EST by telephone and verified that I am speaking with the correct person using two identifiers.   I discussed the limitations, risks, security and privacy concerns of performing an evaluation and management service by telephone and the availability of in person appointments. I also discussed with the patient that there may be a patient responsible charge related to this service. The patient expressed understanding and agreed to proceed.   History of Present Illness:  09/28/2024 ALL (Mychart): Kathy Norman returns for follow up for OSA on CPAP. She continues to do well on therapy. She is using CPAP nightly for about 8.5 hours, on average. She is sleeping well. She denies concerns with machine or supplies.     09/24/2023 ALL (Mychart):  Kathy Norman is a 58 y.o. female here today for follow up for OSA on CPAP. She continues to do well on therapy. She is using CPAP nightly for about 8 hours, on average. She loves using CPAP. She denies concerns with machine or supplies.      History (copied from Dr Obie previous note)  I saw Kathy Norman as a referral from Dr. Vinson office for evaluation of her sleep apnea, request for transfer of care.  The patient is unaccompanied today.  Kathy Norman is a 58 year old female with an underlying medical history of hypertension, depression, hypothyroidism, back pain, reflux disease, and obesity, who reports doing well on her autoPAP. She is not sure about the pressure setting. A DL was not available, we could not get the SD card to read her data, she has a ResMed air sense 11 AutoSet machine.  She reports a prior diagnosis of obstructive sleep apnea.  She has been on PAP therapy for the past 2 years.  She reports full compliance and great benefit, she reports that her  daytime sleepiness improved, her sleep quality improved and her nocturia improved.  She is in the process of transitioning to a new primary care as well.  Her bedtime is generally around 9 PM, rise time is around 7:30 AM or 8 AM.  She is single and lives alone, no pets in the household, she has 1 grown son.  She is on disability for her low back pain and her depression.  She is followed by mental health.  She is a non-smoker and does not drink any alcohol, she drinks quite a bit of caffeine in the form of soda, about 4 16.9 ounce bottles per day.  She is working on weight loss.  She is not followed by weight management clinic.  She has no night to night nocturia and no recurrent nocturnal or morning headaches.  She uses a nasal mask, DME company is [employer].  Her Epworth sleepiness score is 6 out of 24, fatigue severity score is 9 out of 63. She had a baseline sleep study from [facility] on 06/08/2020 and I was able to review the results: She had mild sleep apnea, AHI was 10.7/h.  Her sleep latency was delayed at 108.9 minutes.  Sleep efficiency was reduced at 70.9%.  REM sleep was absent.  Average oxygen saturation was 93%, nadir was 87%.    Addendum, 09/25/2022: I reviewed patients compliance data for the recent 90 days from 06/20/2022 through 09/17/2022, she was fully compliant with treatment with an average usage of 7-1/2  hours, leak acceptable, 95th percentile of pressure at 11.4 cm, AHI at goal with an average of 0.6/h.   Observations/Objective:  Generalized: Well developed, in no acute distress  Mentation: Alert oriented to time, place, history taking. Follows all commands speech and language fluent   Assessment and Plan:  58 y.o. year old female  has a past medical history of Allergy (06/2021), Depression, Hypertension, and Thyroid  disease. here with    ICD-10-CM   1. OSA on CPAP  G47.33       Iyanah reports doing well on CPAP therapy. Compliance report shows excellent  compliance. She was encouraged to continue nightly use for at least 4 hours. Supply orders renewed. Healthy lifestyle habits encouraged. She will follow up with me in 1 year, sooner if needed.    No orders of the defined types were placed in this encounter.   No orders of the defined types were placed in this encounter.    Follow Up Instructions:  I discussed the assessment and treatment plan with the patient. The patient was provided an opportunity to ask questions and all were answered. The patient agreed with the plan and demonstrated an understanding of the instructions.   The patient was advised to call back or seek an in-person evaluation if the symptoms worsen or if the condition fails to improve as anticipated.  I provided 15 minutes of non-face-to-face time during this encounter. Patient located at their place of residence during Mychart visit. Provider is in the office.    Keelan Tripodi, NP

## 2024-09-27 NOTE — Patient Instructions (Signed)

## 2024-09-28 ENCOUNTER — Encounter: Payer: Self-pay | Admitting: Family Medicine

## 2024-09-28 ENCOUNTER — Telehealth: Payer: 59 | Admitting: Family Medicine

## 2024-09-28 DIAGNOSIS — G4733 Obstructive sleep apnea (adult) (pediatric): Secondary | ICD-10-CM | POA: Diagnosis not present

## 2024-11-15 ENCOUNTER — Telehealth: Payer: Self-pay

## 2024-11-15 NOTE — Telephone Encounter (Signed)
 Please review for increased Topamax  dose.

## 2024-11-15 NOTE — Telephone Encounter (Signed)
 Copied from CRM [id number]. Topic: Clinical - Medication Refill >> Nov 15, 2024 12:10 PM Mia F wrote: Medication: topiramate  (TOPAMAX ) 50 MG tablet- Pt was told if she need a higher MG to let her know and she would like to go up.   Has the patient contacted their pharmacy? Yes (Agent: If no, request that the patient contact the pharmacy for the refill. If patient does not wish to contact the pharmacy document the reason why and proceed with request.) (Agent: If yes, when and what did the pharmacy advise?)  This is the patient's preferred pharmacy:  [id number] - [place], [place] - [address] [place] [address] Phone: [phone] Fax: [phone]  Is this the correct pharmacy for this prescription? Yes If no, delete pharmacy and type the correct one.   Has the prescription been filled recently? Yes  Is the patient out of the medication? Yes  Has the patient been seen for an appointment in the last year OR does the patient have an upcoming appointment? Yes  Can we respond through MyChart? Yes  Agent: Please be advised that Rx refills may take up to 3 business days. We ask that you follow-up with your pharmacy.

## 2024-11-16 NOTE — Telephone Encounter (Signed)
 Left message for patient to call back

## 2024-11-16 NOTE — Telephone Encounter (Signed)
 Spoke with patient and she confirmed that she is not having the dizzy spells anymore. Wants to go back to taking 2 in the AM and 2 at night.

## 2024-11-17 ENCOUNTER — Other Ambulatory Visit: Payer: Self-pay

## 2024-11-17 DIAGNOSIS — F5081 Binge eating disorder, mild: Secondary | ICD-10-CM

## 2024-11-17 MED ORDER — TOPIRAMATE 50 MG PO TABS: 100.0000 mg | ORAL_TABLET | Freq: Two times a day (BID) | ORAL | 0 refills | Status: AC

## 2024-11-17 MED ORDER — METOPROLOL SUCCINATE ER 50 MG PO TB24: 100.0000 mg | ORAL_TABLET | Freq: Two times a day (BID) | ORAL | 0 refills | Status: DC

## 2024-11-17 NOTE — Telephone Encounter (Signed)
 Patient states she is not symptomatic.  New rx sent and patient aware

## 2024-11-17 NOTE — Addendum Note (Signed)
 Addended by: OLENA HARLENE BROCKS on: 11/17/2024 09:44 AM   Modules accepted: Orders

## 2025-05-05 ENCOUNTER — Encounter: Admitting: Family Medicine

## 2025-07-12 ENCOUNTER — Telehealth: Admitting: Family Medicine
